# Patient Record
Sex: Female | Born: 1970 | Race: Black or African American | Hispanic: No | Marital: Single | Smoking: Never smoker
Health system: Southern US, Community
[De-identification: ages and names within clinical notes are randomized; demographics above are authoritative.]

## PROBLEM LIST (undated history)

## (undated) DIAGNOSIS — C801 Malignant (primary) neoplasm, unspecified: Secondary | ICD-10-CM

## (undated) DIAGNOSIS — G43909 Migraine, unspecified, not intractable, without status migrainosus: Secondary | ICD-10-CM

## (undated) DIAGNOSIS — I2699 Other pulmonary embolism without acute cor pulmonale: Secondary | ICD-10-CM

## (undated) DIAGNOSIS — J45909 Unspecified asthma, uncomplicated: Secondary | ICD-10-CM

## (undated) DIAGNOSIS — R112 Nausea with vomiting, unspecified: Secondary | ICD-10-CM

## (undated) DIAGNOSIS — R609 Edema, unspecified: Secondary | ICD-10-CM

## (undated) DIAGNOSIS — D649 Anemia, unspecified: Secondary | ICD-10-CM

## (undated) DIAGNOSIS — J189 Pneumonia, unspecified organism: Secondary | ICD-10-CM

## (undated) DIAGNOSIS — R32 Unspecified urinary incontinence: Secondary | ICD-10-CM

## (undated) DIAGNOSIS — N879 Dysplasia of cervix uteri, unspecified: Secondary | ICD-10-CM

## (undated) DIAGNOSIS — N390 Urinary tract infection, site not specified: Secondary | ICD-10-CM

## (undated) DIAGNOSIS — J40 Bronchitis, not specified as acute or chronic: Secondary | ICD-10-CM

## (undated) DIAGNOSIS — K219 Gastro-esophageal reflux disease without esophagitis: Secondary | ICD-10-CM

## (undated) DIAGNOSIS — Z9889 Other specified postprocedural states: Secondary | ICD-10-CM

## (undated) DIAGNOSIS — T148XXA Other injury of unspecified body region, initial encounter: Secondary | ICD-10-CM

## (undated) HISTORY — DX: Anemia, unspecified: D64.9

## (undated) HISTORY — PX: COLONOSCOPY: SHX174

## (undated) HISTORY — DX: Edema, unspecified: R60.9

## (undated) HISTORY — PX: FIBULA FRACTURE SURGERY: SHX947

## (undated) HISTORY — DX: Nausea with vomiting, unspecified: R11.2

## (undated) HISTORY — DX: Urinary tract infection, site not specified: N39.0

## (undated) HISTORY — DX: Bronchitis, not specified as acute or chronic: J40

## (undated) HISTORY — DX: Other specified postprocedural states: Z98.890

## (undated) HISTORY — PX: TOTAL ANKLE REPLACEMENT: SUR1218

## (undated) HISTORY — DX: Unspecified urinary incontinence: R32

## (undated) HISTORY — DX: Unspecified asthma, uncomplicated: J45.909

## (undated) HISTORY — DX: Pneumonia, unspecified organism: J18.9

## (undated) HISTORY — DX: Gastro-esophageal reflux disease without esophagitis: K21.9

## (undated) HISTORY — DX: Other injury of unspecified body region, initial encounter: T14.8XXA

## (undated) HISTORY — DX: Dysplasia of cervix uteri, unspecified: N87.9

---

## 2007-11-25 DIAGNOSIS — I2699 Other pulmonary embolism without acute cor pulmonale: Secondary | ICD-10-CM

## 2007-11-25 HISTORY — DX: Other pulmonary embolism without acute cor pulmonale: I26.99

## 2012-11-24 DIAGNOSIS — Z5189 Encounter for other specified aftercare: Secondary | ICD-10-CM

## 2012-11-24 HISTORY — DX: Encounter for other specified aftercare: Z51.89

## 2013-10-18 ENCOUNTER — Encounter: Payer: Self-pay | Admitting: Pain Medicine

## 2013-10-18 ENCOUNTER — Ambulatory Visit: Payer: Self-pay

## 2013-10-18 ENCOUNTER — Ambulatory Visit: Payer: BLUE CROSS/BLUE SHIELD | Admitting: Pain Medicine

## 2013-10-18 ENCOUNTER — Ambulatory Visit
Admission: RE | Admit: 2013-10-18 | Discharge: 2013-10-18 | Disposition: A | Discharge status: Home / Self Care | Payer: BLUE CROSS/BLUE SHIELD | Source: Ambulatory Visit | Attending: Obstetrics & Gynecology | Admitting: Obstetrics & Gynecology

## 2013-10-18 ENCOUNTER — Encounter
Admission: RE | Disposition: A | Discharge status: Home / Self Care | Payer: Self-pay | Source: Ambulatory Visit | Attending: Obstetrics & Gynecology

## 2013-10-18 ENCOUNTER — Ambulatory Visit: Payer: BLUE CROSS/BLUE SHIELD | Admitting: Obstetrics & Gynecology

## 2013-10-18 DIAGNOSIS — Z91040 Latex allergy status: Secondary | ICD-10-CM | POA: Insufficient documentation

## 2013-10-18 DIAGNOSIS — Z86711 Personal history of pulmonary embolism: Secondary | ICD-10-CM | POA: Insufficient documentation

## 2013-10-18 DIAGNOSIS — N925 Other specified irregular menstruation: Secondary | ICD-10-CM | POA: Insufficient documentation

## 2013-10-18 DIAGNOSIS — J45909 Unspecified asthma, uncomplicated: Secondary | ICD-10-CM | POA: Insufficient documentation

## 2013-10-18 DIAGNOSIS — D250 Submucous leiomyoma of uterus: Secondary | ICD-10-CM | POA: Insufficient documentation

## 2013-10-18 DIAGNOSIS — Z880 Allergy status to penicillin: Secondary | ICD-10-CM | POA: Insufficient documentation

## 2013-10-18 DIAGNOSIS — N938 Other specified abnormal uterine and vaginal bleeding: Secondary | ICD-10-CM | POA: Diagnosis present

## 2013-10-18 HISTORY — PX: D & C, HYSTEROSCOPY: SHX3660

## 2013-10-18 LAB — POCT PREGNANCY TEST, URINE HCG
POCT Pregnancy HCG Test, UR: NEGATIVE
POCT Pregnancy HCG Test, UR: NEGATIVE

## 2013-10-18 SURGERY — HYSTEROSCOPY, ENDOMETRIAL BIOPSY/POLYPECTOMY, WITH DILATION AND CURETTAGE (D&C)
Anesthesia: Anesthesia General | Site: Pelvis | Wound class: Clean Contaminated

## 2013-10-18 MED ORDER — ONDANSETRON HCL 4 MG/2ML IJ SOLN
INTRAMUSCULAR | Status: AC
Start: 2013-10-18 — End: ?
  Filled 2013-10-18: qty 2

## 2013-10-18 MED ORDER — PROPOFOL 10 MG/ML IV EMUL
INTRAVENOUS | Status: AC
Start: 2013-10-18 — End: ?
  Filled 2013-10-18: qty 20

## 2013-10-18 MED ORDER — MORPHINE SULFATE 2 MG/ML IJ/IV SOLN (WRAP)
Status: AC
Start: 2013-10-18 — End: 2013-10-18
  Administered 2013-10-18: 2 mg via INTRAVENOUS
  Filled 2013-10-18: qty 1

## 2013-10-18 MED ORDER — DEXAMETHASONE SOD PHOSPHATE PF 10 MG/ML IJ SOLN
INTRAMUSCULAR | Status: AC
Start: 2013-10-18 — End: ?
  Filled 2013-10-18: qty 1

## 2013-10-18 MED ORDER — PROPOFOL INFUSION 10 MG/ML
INTRAVENOUS | Status: DC | PRN
Start: 2013-10-18 — End: 2013-10-18
  Administered 2013-10-18: 200 mg via INTRAVENOUS

## 2013-10-18 MED ORDER — LACTATED RINGERS IV SOLN
INTRAVENOUS | Status: DC | PRN
Start: 2013-10-18 — End: 2013-10-18

## 2013-10-18 MED ORDER — LEVALBUTEROL HCL 0.63 MG/3ML IN NEBU
0.6300 mg | INHALATION_SOLUTION | Freq: Once | RESPIRATORY_TRACT | Status: AC
Start: 2013-10-18 — End: 2013-10-18
  Administered 2013-10-18: 0.63 mg via RESPIRATORY_TRACT

## 2013-10-18 MED ORDER — ACETAMINOPHEN-CODEINE #3 300-30 MG PO TABS
1.0000 | ORAL_TABLET | Freq: Once | ORAL | Status: AC
Start: 2013-10-18 — End: 2013-10-18
  Administered 2013-10-18: 1 via ORAL

## 2013-10-18 MED ORDER — MORPHINE SULFATE 2 MG/ML IJ/IV SOLN (WRAP)
2.0000 mg | Status: DC | PRN
Start: 2013-10-18 — End: 2013-10-18

## 2013-10-18 MED ORDER — FENTANYL CITRATE 0.05 MG/ML IJ SOLN
25.0000 ug | INTRAMUSCULAR | Status: DC | PRN
Start: 2013-10-18 — End: 2013-10-18

## 2013-10-18 MED ORDER — LIDOCAINE HCL (PF) 2 % IJ SOLN
INTRAMUSCULAR | Status: AC
Start: 2013-10-18 — End: ?
  Filled 2013-10-18: qty 5

## 2013-10-18 MED ORDER — FENTANYL CITRATE 0.05 MG/ML IJ SOLN
INTRAMUSCULAR | Status: AC
Start: 2013-10-18 — End: ?
  Filled 2013-10-18: qty 2

## 2013-10-18 MED ORDER — PROMETHAZINE HCL 25 MG/ML IJ SOLN
6.2500 mg | Freq: Once | INTRAMUSCULAR | Status: DC | PRN
Start: 2013-10-18 — End: 2013-10-18

## 2013-10-18 MED ORDER — OXYCODONE-ACETAMINOPHEN 5-325 MG PO TABS
1.0000 | ORAL_TABLET | Freq: Once | ORAL | Status: DC | PRN
Start: 2013-10-18 — End: 2013-10-18

## 2013-10-18 MED ORDER — ONDANSETRON HCL 4 MG/2ML IJ SOLN
4.0000 mg | Freq: Once | INTRAMUSCULAR | Status: DC | PRN
Start: 2013-10-18 — End: 2013-10-18

## 2013-10-18 MED ORDER — LEVALBUTEROL HCL 0.63 MG/3ML IN NEBU
0.6300 mg | INHALATION_SOLUTION | Freq: Once | RESPIRATORY_TRACT | Status: DC
Start: 2013-10-18 — End: 2013-10-18
  Filled 2013-10-18 (×2): qty 1

## 2013-10-18 MED ORDER — LACTATED RINGERS IV SOLN
INTRAVENOUS | Status: DC
Start: 2013-10-18 — End: 2013-10-18

## 2013-10-18 MED ORDER — SODIUM CHLORIDE 0.9 % IR SOLN
Status: DC | PRN
Start: 2013-10-18 — End: 2013-10-18
  Administered 2013-10-18: 200 mL

## 2013-10-18 MED ORDER — KETOROLAC TROMETHAMINE 60 MG/2ML IM SOLN
INTRAMUSCULAR | Status: AC
Start: 2013-10-18 — End: ?
  Filled 2013-10-18: qty 2

## 2013-10-18 MED ORDER — MIDAZOLAM HCL 5 MG/5ML IJ SOLN
INTRAMUSCULAR | Status: AC
Start: 2013-10-18 — End: ?
  Filled 2013-10-18: qty 5

## 2013-10-18 MED ORDER — HYDROMORPHONE HCL PF 1 MG/ML IJ SOLN
0.2000 mg | INTRAMUSCULAR | Status: DC | PRN
Start: 2013-10-18 — End: 2013-10-18

## 2013-10-18 MED ORDER — ACETAMINOPHEN-CODEINE #3 300-30 MG PO TABS
ORAL_TABLET | ORAL | Status: AC
Start: 2013-10-18 — End: ?
  Filled 2013-10-18: qty 1

## 2013-10-18 MED ORDER — FENTANYL CITRATE 0.05 MG/ML IJ SOLN
INTRAMUSCULAR | Status: DC | PRN
Start: 2013-10-18 — End: 2013-10-18
  Administered 2013-10-18 (×2): 50 ug via INTRAVENOUS

## 2013-10-18 MED ORDER — DEXAMETHASONE SODIUM PHOSPHATE 4 MG/ML IJ SOLN (WRAP)
INTRAMUSCULAR | Status: DC | PRN
Start: 2013-10-18 — End: 2013-10-18
  Administered 2013-10-18: 8 mg via INTRAVENOUS

## 2013-10-18 MED ORDER — LIDOCAINE HCL 2 % IJ SOLN
INTRAMUSCULAR | Status: DC | PRN
Start: 2013-10-18 — End: 2013-10-18
  Administered 2013-10-18: 25 mg

## 2013-10-18 MED ORDER — MIDAZOLAM HCL 2 MG/2ML IJ SOLN
INTRAMUSCULAR | Status: DC | PRN
Start: 2013-10-18 — End: 2013-10-18
  Administered 2013-10-18: 3 mg via INTRAVENOUS

## 2013-10-18 MED ORDER — ONDANSETRON HCL 4 MG/2ML IJ SOLN
INTRAMUSCULAR | Status: DC | PRN
Start: 2013-10-18 — End: 2013-10-18
  Administered 2013-10-18: 4 mg via INTRAVENOUS

## 2013-10-18 SURGICAL SUPPLY — 28 items
CATH URETHRAL RED RUBBER 16F (Catheter Urine) ×2 IMPLANT
DRAPE UNDER BUTTOCKS ADHESIVE AURORA 40X44IN DYNJP6004 (Drape) ×1 IMPLANT
DRAPE UNDERBUTTOCKS W/POUCH (Drape) ×2
DRESSING TELFA 3X8IN STERILE (Dressing) ×2 IMPLANT
GLOVE BIOGEL SURG PF SZ 8.5 (Glove) ×2 IMPLANT
GLOVE HANDLE LITE DISP (Procedure Accessories) ×2 IMPLANT
GOWN SRG PRFRM FBRC LG STDLN AERO BLU LF (Gown) ×1
GOWN SURGICAL LARGE STANDARD LENGTH (Gown) ×1
GOWN SURGICAL LARGE STANDARD LENGTH PERFORMANCE FABRIC AERO BLUE GREEN (Gown) ×1 IMPLANT
PACK LITHOTOMY (Pack) ×2 IMPLANT
PAD PREP CUFF 24X41IN W 9IN (Prep) ×2 IMPLANT
PAD SANITARY L12.25 IN X W4.25 IN HEAVY ABSORBENT MOISTURE BARRIER (Dressing) ×1 IMPLANT
PAD SNTR SLK FLF CRTY 12.25X4.25IN LF NS (Dressing) ×2
SET IRR DEHP 10 GTT/ML STRG 81IN LF STRL (Tubing) ×1
SET IRRIGATION L81 IN 10 GTT/ML STRAIGHT (Tubing) ×1
SET IRRIGATION L81 IN 10 GTT/ML STRAIGHT NA DEHP BLADDER REGULATE (Tubing) ×1 IMPLANT
SOL NACL 0.9% IRR 3000CC (Irrigation Solutions) ×1
SOLN LUBRICATING JELLY 4.25OZ (Irrigation Solutions) ×2 IMPLANT
SOLUTION IRR 0.9% NACL 3L URMTC LF PLS (Irrigation Solutions) ×1
SOLUTION IRRIGATION 0.9% SODIUM CHLORIDE 3000 ML PLASTIC CONTAINER (Irrigation Solutions) ×1 IMPLANT
SPONGE SRG VISTEC 8X4IN LF STRL 12 PLY (Sponge) ×1
SPONGE SURGICAL L8 IN X W4 IN 12 PLY (Sponge) ×1
SPONGE SURGICAL L8 IN X W4 IN 12 PLY RADIOPAQUE BAND VISTEC BLUE WHITE (Sponge) ×1 IMPLANT
TOWEL L26 IN X W17 IN COTTON PREWASH (Procedure Accessories) ×1
TOWEL L26 IN X W17 IN COTTON PREWASH DELINT BLUE ACTISORB SURGICAL (Procedure Accessories) ×1 IMPLANT
TOWEL SRG CTTN 26X17IN LF STRL PREWASH (Procedure Accessories) ×1
TRAY SKIN BETANDINE PREP (Tray) ×2 IMPLANT
UNDRGLOV SZ8.5 PI INDICATR BLU (Glove) ×2 IMPLANT

## 2013-10-18 NOTE — Anesthesia Preprocedure Evaluation (Addendum)
Anesthesia Evaluation    AIRWAY    Mallampati: III    TM distance: >3 FB  Neck ROM: full  Mouth Opening:full   CARDIOVASCULAR    regular and normal       DENTAL    No notable dental hx     PULMONARY    pulmonary exam normal     OTHER FINDINGS    Obese BMI 37.9, hx of asthma, currently no wheezing noted                  Anesthesia Plan    ASA 2     general                     intravenous induction   Detailed anesthesia plan: general endotracheal and general LMA  Monitors/Adjuncts: other    Post Op: other  Post op pain management: per surgeon    informed consent obtained    Plan discussed with CRNA.      pertinent labs reviewed

## 2013-10-18 NOTE — H&P (Signed)
ADMISSION HISTORY AND PHYSICAL EXAM    Date Time: 10/18/2013 7:40 AM  Patient Name: Kaitlyn Wilcox  Attending Physician: Denny Levy, MD    History of Present Illness:   Kaitlyn Wilcox is a 42 y.o. female who presents to the hospital for [facility] for heavy menses.    Past Medical History:     Past Medical History   Diagnosis Date   . Asthma without status asthmaticus    . Pulmonary embolism 2009   . Cervical dysplasia    . Post-operative nausea and vomiting        Past Surgical History:   No past surgical history on file.    Family History:     Family History   Problem Relation Age of Onset   . Malignant hyperthermia Neg Hx    . Pseudochol deficiency Neg Hx    . Anesthesia problems Neg Hx        Social History:     History     Social History   . Marital Status: Divorced     Spouse Name: N/A     Number of Children: N/A   . Years of Education: N/A     Social History Main Topics   . Smoking status: Never Smoker    . Smokeless tobacco: Not on file   . Alcohol Use: 1.8 oz/week     3 Glasses of wine per week   . Drug Use:    . Sexually Active:      Other Topics Concern   . Not on file     Social History Narrative   . No narrative on file       Allergies:     Allergies   Allergen Reactions   . Dilaudid (Hydromorphone Hcl) Other (See Comments)     "stop breathing"   . Penicillins Anaphylaxis   . Latex Swelling     Swelling-contact   . Nuts (Peanut Oil) Rash   . Percocet (Oxycodone-Acetaminophen) Hives       Medications:   See Medication List    Review of Systems:   General ROS: negative  Psychological ROS: negative  HEENT ROS: negative  Hematological and Lymphatic ROS: negative  Endocrine ROS: negative  Breast ROS: negative for breast lumps  Respiratory ROS: no cough, shortness of breath, or wheezing  Cardiovascular ROS: no chest pain or dyspnea on exertion  Gastrointestinal ROS: no abdominal pain, change in bowel habits, or black or bloody stools  Genito-Urinary ROS: no dysuria, trouble voiding, or  hematuria  Musculoskeletal ROS: negative  Neurological ROS: no TIA or stroke symptoms  Dermatological ROS: negative    Physical Exam:   There were no vitals filed for this visit.      No intake or output data in the 24 hours ending 10/18/13 0740    General appearance - alert, well appearing, and in no distress  Mental status - alert, oriented to person, place, and time  Neck - supple, no significant adenopathy  Lymphatics - no palpable lymphadenopathy, no hepatosplenomegaly  Chest - clear to auscultation, no wheezes, rales or rhonchi, symmetric air entry  Heart - normal rate, regular rhythm, normal S1, S2, no murmurs, rubs, clicks or gallops  Abdomen - soft, nontender, nondistended, no masses or organomegaly  Breasts - breasts appear normal, no suspicious masses, no skin or nipple changes or axillary nodes  Pelvic - normal external genitalia, vulva, vagina, cervix, enlarged uterus and adnexa  Rectal - normal rectal, no masses  Back  exam - full range of motion, no tenderness, palpable spasm or pain on motion  Neurological - alert, oriented, normal speech, no focal findings or movement disorder noted  Musculoskeletal - no joint tenderness, deformity or swelling  Extremities - peripheral pulses normal, no pedal edema, no clubbing or cyanosis  Skin - normal coloration and turgor, no rashes, no suspicious skin lesions noted    Labs:     Results     ** No Results found for the last 24 hours. **              Rads:   Radiological Procedure reviewed.      Assessment:   DUB    Plan:   D&C        Denny Levy, MD  10/18/2013  7:40 AM

## 2013-10-18 NOTE — Anesthesia Postprocedure Evaluation (Signed)
Anesthesia Post Evaluation    Patient: Kaitlyn Wilcox    Procedures performed: Procedure(s) with comments:  HYSTEROSCOPY, DIAGNOSTIC    Anesthesia type: General LMA    Patient location:Phase II PACU    Last vitals:   Filed Vitals:    10/18/13 1414   BP: 137/68   Pulse:    Temp:    Resp: 16   SpO2: 98%       Post pain: Patient not complaining of pain, continue current therapy      Mental Status:awake and alert     Respiratory Function: tolerating room air    Cardiovascular: stable    Nausea/Vomiting: patient not complaining of nausea or vomiting    Hydration Status: adequate    Post assessment: no apparent anesthetic complications

## 2013-10-18 NOTE — Op Note (Signed)
D&C OP NOTE    Date Time: 10/18/2013 12:36 PM  Patient Name:   Kaitlyn Wilcox    Date of Operation:   10/18/2013    Preoperative Diagnosis:   Pre-Op Diagnosis Codes:     * Other disorder of menstruation and other abnormal bleeding from female genital tract [626.8]    Postoperative Diagnosis:   Fibroid uterus, DUB    Providers Performing:   Denny Levy    Assistant (s): None    Operative Procedure:   Diagnostic Hysteroscopy, Dilatation and Curettage.       Findings:   16 plus week size fibriod uterus    Anesthesia:   General       Estimated Blood Loss:   Minimal      Specimens:   Endometrial Currettings      Indication for Procedure:   Fibroids, DUB      Description of procedure:    The patient was given general anesthesia by the anesthesia department,   prepped and draped in a sterile fashion. When examined in the dorsal   lithotomy position, she was found a 16 week size  uterus. No adnexal masses   could be felt. The cervical os was opened with graduated Shawnie Pons dilators   and a 30-degree hysteroscope used to look into the uterine cavity. The   Cavity ws enlarged and multiple small submucous myomas were seen.  The tissue seen was removed with a medium-size   curette and sent for histologic studies. The patient tolerated the   procedure well and was taken to the recovery room in satisfactory   condition.      Complications:   none    Condition:   stable        Denny Levy, MD  10/18/2013  12:36 PM

## 2013-10-18 NOTE — Transfer of Care (Signed)
Anesthesia Transfer of Care Note    Patient: Kaitlyn Wilcox    Procedures performed: Procedure(s) with comments:  HYSTEROSCOPY, DIAGNOSTIC    Anesthesia type: General LMA    Patient location:Phase I PACU    Last vitals:   Filed Vitals:    10/18/13 1245   BP: 139/69   Pulse: 58   Temp: 97 F (36.1 C)   Resp: 22   SpO2: 100%       Post pain: Patient not complaining of pain, continue current therapy      Mental Status:awake and alert     Respiratory Function: tolerating nasal cannula    Cardiovascular: stable    Nausea/Vomiting: patient not complaining of nausea or vomiting    Hydration Status: adequate    Post assessment: no apparent anesthetic complications, no reportable events and no evidence of recall

## 2013-10-18 NOTE — Discharge Instructions (Signed)
Kenneth A. Desandies, MD    [address] [place] [address], [address]  [address], [address]  [place] [address]    [place], [place] [address]  [phone]              [phone]    Post D&C Instructions    After this procedure, you may feel sluggish for the next 24-48 hours.  However, usually after the first 24 hours, a patient may resume normal activities.    Diet: Resume a normal diet as soon as you can keep fluids down comfortably, however heavy foods, i.e. fried foods, should not be consumed immediately.    Pain:      Advil or Motrin and a heating pad usually relieves most discomfort, however a   prescription for ___________________will be given to you for severe pain.     Bathing:  You may shower at any time. Tub bath should be delayed until the bleeding has      stopped completely.    Bleeding: The amount of bleeding varies greatly.  There may be slight spotting for         several weeks.  Menstrual periods may or may not start at the expected time,         due to the procedure itself or the reason for the procedure, i.e. a miscarriage.           If you have been given a prescription for the birth control pill, start them on          the fifth day after surgery.    Douching:  Do not douche unless instructed to do so.    Intercourse:  If not bleeding, may resume in 2 weeks.    Call immediately for:    Fever greater than 100.4  Frequency or burning with urination  Severe vaginal bleeding, i.e. soaking a pad every 15-30 minutes  Severe lower abdominal pain    Post Anesthesia Discharge Instructions    Although you may be awake and alert in the recovery room, small amounts of anesthetic remain in your system for about 24 hours.  You may feel tired and sleepy during this time.      You are advised to go directly home from the hospital.    Plan to stay at home and rest for the remainder of the day.    It is advisable to have someone with you at home for 24 hours after surgery.    Do not operate a motor vehicle, or any  mechanical or electrical equipment for the next 24 hours.      Be careful when you are walking around, you may become dizzy.  The effects of anesthesia and/or medications are still present and drowsiness may occur    Do not consume alcohol, tranquilizers, sleeping medications, or any other non prescribed medication for the remainder of the day.    Diet:  begin with liquids, progress your diet as tolerated or as directed by your surgeon.  Nausea and vomiting may occur in the next 24 hours.

## 2013-10-19 ENCOUNTER — Encounter: Payer: Self-pay | Admitting: Obstetrics & Gynecology

## 2013-11-01 ENCOUNTER — Encounter
Admission: RE | Disposition: A | Discharge status: Home / Self Care | Payer: Self-pay | Source: Ambulatory Visit | Attending: Obstetrics & Gynecology

## 2013-11-01 ENCOUNTER — Encounter: Payer: Self-pay | Admitting: Anesthesiology

## 2013-11-01 ENCOUNTER — Inpatient Hospital Stay: Payer: BLUE CROSS/BLUE SHIELD | Admitting: Obstetrics & Gynecology

## 2013-11-01 ENCOUNTER — Inpatient Hospital Stay
Admission: RE | Admit: 2013-11-01 | Discharge: 2013-11-04 | DRG: 742 | Disposition: A | Discharge status: Home / Self Care | Payer: BLUE CROSS/BLUE SHIELD | Source: Ambulatory Visit | Attending: Obstetrics & Gynecology | Admitting: Obstetrics & Gynecology

## 2013-11-01 ENCOUNTER — Ambulatory Visit: Payer: BLUE CROSS/BLUE SHIELD | Admitting: Anesthesiology

## 2013-11-01 DIAGNOSIS — K560 Paralytic ileus: Secondary | ICD-10-CM | POA: Diagnosis not present

## 2013-11-01 DIAGNOSIS — Z91040 Latex allergy status: Secondary | ICD-10-CM

## 2013-11-01 DIAGNOSIS — Z885 Allergy status to narcotic agent status: Secondary | ICD-10-CM

## 2013-11-01 DIAGNOSIS — N879 Dysplasia of cervix uteri, unspecified: Secondary | ICD-10-CM | POA: Diagnosis present

## 2013-11-01 DIAGNOSIS — Z23 Encounter for immunization: Secondary | ICD-10-CM

## 2013-11-01 DIAGNOSIS — Z96669 Presence of unspecified artificial ankle joint: Secondary | ICD-10-CM

## 2013-11-01 DIAGNOSIS — N925 Other specified irregular menstruation: Secondary | ICD-10-CM | POA: Diagnosis present

## 2013-11-01 DIAGNOSIS — Z91010 Allergy to peanuts: Secondary | ICD-10-CM

## 2013-11-01 DIAGNOSIS — J45909 Unspecified asthma, uncomplicated: Secondary | ICD-10-CM | POA: Diagnosis present

## 2013-11-01 DIAGNOSIS — Z880 Allergy status to penicillin: Secondary | ICD-10-CM

## 2013-11-01 DIAGNOSIS — N8000 Endometriosis of the uterus, unspecified: Principal | ICD-10-CM | POA: Diagnosis present

## 2013-11-01 DIAGNOSIS — Z86711 Personal history of pulmonary embolism: Secondary | ICD-10-CM

## 2013-11-01 DIAGNOSIS — Y9210 Unspecified residential institution as the place of occurrence of the external cause: Secondary | ICD-10-CM | POA: Diagnosis not present

## 2013-11-01 DIAGNOSIS — D259 Leiomyoma of uterus, unspecified: Secondary | ICD-10-CM | POA: Diagnosis present

## 2013-11-01 DIAGNOSIS — K929 Disease of digestive system, unspecified: Secondary | ICD-10-CM | POA: Diagnosis not present

## 2013-11-01 DIAGNOSIS — Y836 Removal of other organ (partial) (total) as the cause of abnormal reaction of the patient, or of later complication, without mention of misadventure at the time of the procedure: Secondary | ICD-10-CM | POA: Diagnosis not present

## 2013-11-01 HISTORY — PX: HYSTERECTOMY, ABDOMINAL, TOTAL: SHX4214

## 2013-11-01 LAB — BLOOD GAS, VENOUS
Base Excess, Ven: 0 mEq/L
HCO3, Ven: 25.2 mEq/L
O2 Sat, Venous: 50.6 %
Temperature: 37
Venous Total CO2: 23.1 mEq/L
pCO2, Venous: 45.5
pH, Ven: 7.362
pO2, Venous: 30.6

## 2013-11-01 LAB — TYPE AND SCREEN
AB Screen Gel: NEGATIVE
ABO Rh: O POS

## 2013-11-01 LAB — POCT PREGNANCY TEST, URINE HCG: POCT Pregnancy HCG Test, UR: NEGATIVE

## 2013-11-01 SURGERY — HYSTERECTOMY, ABDOMINAL, TOTAL (OPEN)
Anesthesia: Anesthesia General | Site: Pelvis | Wound class: Clean Contaminated

## 2013-11-01 MED ORDER — NALOXONE HCL 0.4 MG/ML IJ SOLN
0.1000 mg | INTRAMUSCULAR | Status: DC | PRN
Start: 2013-11-01 — End: 2013-11-01

## 2013-11-01 MED ORDER — KETOROLAC TROMETHAMINE 30 MG/ML IJ SOLN
30.0000 mg | Freq: Four times a day (QID) | INTRAMUSCULAR | Status: DC
Start: 2013-11-01 — End: 2013-11-04
  Administered 2013-11-01 – 2013-11-04 (×12): 30 mg via INTRAVENOUS
  Filled 2013-11-01 (×12): qty 1

## 2013-11-01 MED ORDER — DEXAMETHASONE SODIUM PHOSPHATE 4 MG/ML IJ SOLN (WRAP)
INTRAMUSCULAR | Status: DC | PRN
Start: 2013-11-01 — End: 2013-11-01
  Administered 2013-11-01: 10 mg via INTRAVENOUS

## 2013-11-01 MED ORDER — FENTANYL CITRATE 0.05 MG/ML IJ SOLN
INTRAMUSCULAR | Status: DC | PRN
Start: 2013-11-01 — End: 2013-11-01
  Administered 2013-11-01: 50 ug via INTRAVENOUS
  Administered 2013-11-01: 150 ug via INTRAVENOUS
  Administered 2013-11-01 (×2): 100 ug via INTRAVENOUS
  Administered 2013-11-01 (×2): 50 ug via INTRAVENOUS

## 2013-11-01 MED ORDER — SODIUM CHLORIDE 0.9 % IV SOLN
INTRAVENOUS | Status: DC | PRN
Start: 2013-11-01 — End: 2013-11-01

## 2013-11-01 MED ORDER — PNEUMOCOCCAL VAC POLYVALENT 25 MCG/0.5ML IJ INJ
0.5000 mL | INJECTION | INTRAMUSCULAR | Status: AC | PRN
Start: 2013-11-01 — End: 2013-11-04
  Administered 2013-11-04: 0.5 mL via INTRAMUSCULAR
  Filled 2013-11-01: qty 0.5

## 2013-11-01 MED ORDER — ENOXAPARIN SODIUM 40 MG/0.4ML SC SOLN
40.0000 mg | Freq: Every morning | SUBCUTANEOUS | Status: DC
Start: 2013-11-02 — End: 2013-11-04
  Administered 2013-11-02 – 2013-11-04 (×3): 40 mg via SUBCUTANEOUS
  Filled 2013-11-01 (×3): qty 0.4

## 2013-11-01 MED ORDER — LEVOFLOXACIN IN D5W 500 MG/100ML IV SOLN
500.0000 mg | Freq: Once | INTRAVENOUS | Status: AC
Start: 2013-11-01 — End: 2013-11-01
  Administered 2013-11-01: 500 mg via INTRAVENOUS

## 2013-11-01 MED ORDER — MORPHINE SULFATE 2 MG/ML IJ/IV SOLN (WRAP)
4.0000 mg | Freq: Once | Status: AC
Start: 2013-11-01 — End: 2013-11-01
  Administered 2013-11-01: 4 mg via INTRAVENOUS
  Filled 2013-11-01: qty 2

## 2013-11-01 MED ORDER — ALBUTEROL SULFATE (2.5 MG/3ML) 0.083% IN NEBU
2.5000 mg | INHALATION_SOLUTION | RESPIRATORY_TRACT | Status: DC
Start: 2013-11-01 — End: 2013-11-02

## 2013-11-01 MED ORDER — FENTANYL CITRATE 0.05 MG/ML IJ SOLN
INTRAMUSCULAR | Status: AC
Start: 2013-11-01 — End: 2013-11-01
  Administered 2013-11-01: 25 ug via INTRAVENOUS
  Filled 2013-11-01: qty 2

## 2013-11-01 MED ORDER — CLINDAMYCIN PHOSPHATE IN D5W 900 MG/50ML IV SOLN
900.0000 mg | Freq: Once | INTRAVENOUS | Status: DC
Start: 2013-11-01 — End: 2013-11-01

## 2013-11-01 MED ORDER — LACTATED RINGERS IV SOLN
INTRAVENOUS | Status: DC | PRN
Start: 2013-11-01 — End: 2013-11-01

## 2013-11-01 MED ORDER — FENTANYL CITRATE 0.05 MG/ML IJ SOLN
INTRAMUSCULAR | Status: AC
Start: 2013-11-01 — End: ?
  Filled 2013-11-01: qty 5

## 2013-11-01 MED ORDER — MONTELUKAST SODIUM 10 MG PO TABS
10.0000 mg | ORAL_TABLET | Freq: Every evening | ORAL | Status: DC
Start: 2013-11-01 — End: 2013-11-04
  Administered 2013-11-01 – 2013-11-03 (×3): 10 mg via ORAL
  Filled 2013-11-01 (×3): qty 1

## 2013-11-01 MED ORDER — SENNOSIDES-DOCUSATE SODIUM 8.6-50 MG PO TABS
1.0000 | ORAL_TABLET | Freq: Two times a day (BID) | ORAL | Status: DC | PRN
Start: 2013-11-01 — End: 2013-11-01

## 2013-11-01 MED ORDER — NALOXONE HCL 0.4 MG/ML IJ SOLN
0.1000 mg | INTRAMUSCULAR | Status: AC | PRN
Start: 2013-11-01 — End: 2013-11-02

## 2013-11-01 MED ORDER — LEVOFLOXACIN IN D5W 500 MG/100ML IV SOLN
INTRAVENOUS | Status: AC
Start: 2013-11-01 — End: ?
  Filled 2013-11-01: qty 100

## 2013-11-01 MED ORDER — SENNOSIDES-DOCUSATE SODIUM 8.6-50 MG PO TABS
1.0000 | ORAL_TABLET | Freq: Two times a day (BID) | ORAL | Status: DC | PRN
Start: 2013-11-01 — End: 2013-11-04

## 2013-11-01 MED ORDER — HYDROMORPHONE HCL 2 MG/ML IJ SOLN
INTRAMUSCULAR | Status: AC
Start: 2013-11-01 — End: ?
  Filled 2013-11-01: qty 1

## 2013-11-01 MED ORDER — HEPARIN SODIUM (PORCINE) 5000 UNIT/ML IJ SOLN
5000.0000 [IU] | Freq: Two times a day (BID) | INTRAMUSCULAR | Status: DC
Start: 2013-11-01 — End: 2013-11-01

## 2013-11-01 MED ORDER — FENTANYL PCA INFUSION 10 MCG/ML 100 ML (OUTSOURCED)
INTRAVENOUS | Status: DC
Start: 2013-11-01 — End: 2013-11-01
  Administered 2013-11-01: 1000 ug via INTRAVENOUS
  Filled 2013-11-01: qty 100

## 2013-11-01 MED ORDER — METHYLENE BLUE 1 % IJ SOLN
INTRAMUSCULAR | Status: DC | PRN
Start: 2013-11-01 — End: 2013-11-01
  Administered 2013-11-01: 100 mg via INTRAVENOUS

## 2013-11-01 MED ORDER — CEPASTAT 14.5 MG MT LOZG
1.0000 | LOZENGE | OROMUCOSAL | Status: DC | PRN
Start: 2013-11-01 — End: 2013-11-04
  Administered 2013-11-01: 1 via BUCCAL
  Filled 2013-11-01: qty 1

## 2013-11-01 MED ORDER — OXYCODONE-ACETAMINOPHEN 5-325 MG PO TABS
2.0000 | ORAL_TABLET | ORAL | Status: DC | PRN
Start: 2013-11-01 — End: 2013-11-01

## 2013-11-01 MED ORDER — PROMETHAZINE HCL 25 MG/ML IJ SOLN
6.2500 mg | Freq: Once | INTRAMUSCULAR | Status: AC | PRN
Start: 2013-11-01 — End: 2013-11-01

## 2013-11-01 MED ORDER — CEFAZOLIN SODIUM 1 G IJ SOLR
1.0000 g | Freq: Three times a day (TID) | INTRAMUSCULAR | Status: DC
Start: 2013-11-01 — End: 2013-11-01

## 2013-11-01 MED ORDER — MIDAZOLAM HCL 2 MG/2ML IJ SOLN
INTRAMUSCULAR | Status: AC
Start: 2013-11-01 — End: ?
  Filled 2013-11-01: qty 2

## 2013-11-01 MED ORDER — MORPHINE PCA 1MG/ML IN 100 ML NS-OUTSOURCED
INTRAVENOUS | Status: DC
Start: 2013-11-01 — End: 2013-11-02
  Administered 2013-11-01: 100 mg via INTRAVENOUS
  Filled 2013-11-01: qty 100

## 2013-11-01 MED ORDER — GLYCOPYRROLATE 0.2 MG/ML IJ SOLN
INTRAMUSCULAR | Status: AC
Start: 2013-11-01 — End: ?
  Filled 2013-11-01: qty 3

## 2013-11-01 MED ORDER — METHYLENE BLUE 1 % IJ SOLN
INTRAMUSCULAR | Status: AC
Start: 2013-11-01 — End: ?
  Filled 2013-11-01: qty 1

## 2013-11-01 MED ORDER — PROMETHAZINE HCL 25 MG/ML IJ SOLN
12.5000 mg | INTRAMUSCULAR | Status: DC | PRN
Start: 2013-11-01 — End: 2013-11-04
  Administered 2013-11-02: 12.5 mg via INTRAVENOUS
  Filled 2013-11-01: qty 1

## 2013-11-01 MED ORDER — LACTATED RINGERS IV SOLN
INTRAVENOUS | Status: DC
Start: 2013-11-01 — End: 2013-11-01

## 2013-11-01 MED ORDER — LIDOCAINE HCL (PF) 2 % IJ SOLN
INTRAMUSCULAR | Status: AC
Start: 2013-11-01 — End: ?
  Filled 2013-11-01: qty 5

## 2013-11-01 MED ORDER — FENTANYL PCA INFUSION 10 MCG/ML 100 ML (OUTSOURCED)
INTRAVENOUS | Status: AC
Start: 2013-11-01 — End: ?
  Filled 2013-11-01: qty 100

## 2013-11-01 MED ORDER — LABETALOL HCL 5 MG/ML IV SOLN
INTRAVENOUS | Status: AC
Start: 2013-11-01 — End: ?
  Filled 2013-11-01: qty 20

## 2013-11-01 MED ORDER — PROPOFOL INFUSION 10 MG/ML
INTRAVENOUS | Status: DC | PRN
Start: 2013-11-01 — End: 2013-11-01
  Administered 2013-11-01: 100 mg via INTRAVENOUS
  Administered 2013-11-01: 200 mg via INTRAVENOUS

## 2013-11-01 MED ORDER — NALBUPHINE HCL 10 MG/ML IJ SOLN
2.5000 mg | INTRAMUSCULAR | Status: AC | PRN
Start: 2013-11-01 — End: 2013-11-02
  Filled 2013-11-01: qty 1

## 2013-11-01 MED ORDER — PROPOFOL 10 MG/ML IV EMUL
INTRAVENOUS | Status: AC
Start: 2013-11-01 — End: ?
  Filled 2013-11-01: qty 20

## 2013-11-01 MED ORDER — PROMETHAZINE HCL 25 MG/ML IJ SOLN
12.5000 mg | INTRAMUSCULAR | Status: DC | PRN
Start: 2013-11-01 — End: 2013-11-01

## 2013-11-01 MED ORDER — DIPHENHYDRAMINE HCL 50 MG/ML IJ SOLN
12.5000 mg | Freq: Four times a day (QID) | INTRAMUSCULAR | Status: AC | PRN
Start: 2013-11-01 — End: 2013-11-02

## 2013-11-01 MED ORDER — HYDROCODONE-ACETAMINOPHEN 5-325 MG PO TABS
1.0000 | ORAL_TABLET | ORAL | Status: DC | PRN
Start: 2013-11-01 — End: 2013-11-01

## 2013-11-01 MED ORDER — LABETALOL HCL 5 MG/ML IV SOLN
INTRAVENOUS | Status: DC | PRN
Start: 2013-11-01 — End: 2013-11-01
  Administered 2013-11-01 (×2): 5 mg via INTRAVENOUS

## 2013-11-01 MED ORDER — LIDOCAINE HCL 2 % IJ SOLN
INTRAMUSCULAR | Status: DC | PRN
Start: 2013-11-01 — End: 2013-11-01
  Administered 2013-11-01: 100 mg

## 2013-11-01 MED ORDER — ONDANSETRON HCL 4 MG/2ML IJ SOLN
INTRAMUSCULAR | Status: AC
Start: 2013-11-01 — End: ?
  Filled 2013-11-01: qty 2

## 2013-11-01 MED ORDER — METHYLENE BLUE 1 % IJ SOLN
INTRAMUSCULAR | Status: AC
Start: 2013-11-01 — End: ?
  Filled 2013-11-01: qty 10

## 2013-11-01 MED ORDER — NEOSTIGMINE METHYLSULFATE 1 MG/ML IJ SOLN
INTRAMUSCULAR | Status: DC | PRN
Start: 2013-11-01 — End: 2013-11-01
  Administered 2013-11-01: 6 mL via INTRAVENOUS

## 2013-11-01 MED ORDER — CALCIUM CARBONATE ANTACID 500 MG PO CHEW
1000.0000 mg | CHEWABLE_TABLET | Freq: Four times a day (QID) | ORAL | Status: DC | PRN
Start: 2013-11-01 — End: 2013-11-04
  Administered 2013-11-01 – 2013-11-03 (×4): 1000 mg via ORAL
  Filled 2013-11-01 (×4): qty 2

## 2013-11-01 MED ORDER — DIPHENHYDRAMINE HCL 50 MG/ML IJ SOLN
12.5000 mg | Freq: Four times a day (QID) | INTRAMUSCULAR | Status: DC | PRN
Start: 2013-11-01 — End: 2013-11-01

## 2013-11-01 MED ORDER — ONDANSETRON HCL 4 MG/2ML IJ SOLN
4.0000 mg | Freq: Once | INTRAMUSCULAR | Status: AC | PRN
Start: 2013-11-01 — End: 2013-11-01
  Administered 2013-11-01: 4 mg via INTRAVENOUS
  Filled 2013-11-01: qty 2

## 2013-11-01 MED ORDER — ONDANSETRON HCL 4 MG/2ML IJ SOLN
INTRAMUSCULAR | Status: DC | PRN
Start: 2013-11-01 — End: 2013-11-01
  Administered 2013-11-01: 4 mg via INTRAVENOUS

## 2013-11-01 MED ORDER — DEXAMETHASONE SOD PHOSPHATE PF 10 MG/ML IJ SOLN
INTRAMUSCULAR | Status: AC
Start: 2013-11-01 — End: ?
  Filled 2013-11-01: qty 1

## 2013-11-01 MED ORDER — ROCURONIUM BROMIDE 50 MG/5ML IV SOLN
INTRAVENOUS | Status: DC | PRN
Start: 2013-11-01 — End: 2013-11-01
  Administered 2013-11-01: 50 mg via INTRAVENOUS

## 2013-11-01 MED ORDER — PROMETHAZINE HCL 25 MG/ML IJ SOLN
6.2500 mg | Freq: Once | INTRAMUSCULAR | Status: DC | PRN
Start: 2013-11-01 — End: 2013-11-01

## 2013-11-01 MED ORDER — MIDAZOLAM HCL 2 MG/2ML IJ SOLN
INTRAMUSCULAR | Status: DC | PRN
Start: 2013-11-01 — End: 2013-11-01
  Administered 2013-11-01: 2 mg via INTRAVENOUS

## 2013-11-01 MED ORDER — CLINDAMYCIN PHOSPHATE IN D5W 600 MG/50ML IV SOLN
INTRAVENOUS | Status: DC | PRN
Start: 2013-11-01 — End: 2013-11-01
  Administered 2013-11-01: 900 mg via INTRAVENOUS

## 2013-11-01 MED ORDER — ONDANSETRON HCL 4 MG/2ML IJ SOLN
4.0000 mg | Freq: Once | INTRAMUSCULAR | Status: DC | PRN
Start: 2013-11-01 — End: 2013-11-01

## 2013-11-01 MED ORDER — LACTATED RINGERS IV SOLN
INTRAVENOUS | Status: DC
Start: 2013-11-01 — End: 2013-11-04

## 2013-11-01 MED ORDER — FENTANYL CITRATE 0.05 MG/ML IJ SOLN
25.0000 ug | INTRAMUSCULAR | Status: AC | PRN
Start: 2013-11-01 — End: 2013-11-01
  Administered 2013-11-01 (×3): 25 ug via INTRAVENOUS

## 2013-11-01 MED ORDER — ROCURONIUM BROMIDE 50 MG/5ML IV SOLN
INTRAVENOUS | Status: AC
Start: 2013-11-01 — End: ?
  Filled 2013-11-01: qty 5

## 2013-11-01 MED ORDER — HEPARIN SODIUM (PORCINE) 5000 UNIT/ML IJ SOLN
5000.0000 [IU] | Freq: Two times a day (BID) | INTRAMUSCULAR | Status: DC
Start: 2013-11-01 — End: 2013-11-01
  Administered 2013-11-01: 5000 [IU] via SUBCUTANEOUS
  Filled 2013-11-01: qty 1

## 2013-11-01 MED ORDER — CLINDAMYCIN PHOSPHATE IN D5W 600 MG/50ML IV SOLN
600.0000 mg | Freq: Three times a day (TID) | INTRAVENOUS | Status: AC
Start: 2013-11-01 — End: 2013-11-02
  Administered 2013-11-01 – 2013-11-02 (×2): 600 mg via INTRAVENOUS
  Filled 2013-11-01 (×2): qty 50

## 2013-11-01 MED ORDER — PROMETHAZINE HCL 25 MG/ML IJ SOLN
INTRAMUSCULAR | Status: AC
Start: 2013-11-01 — End: 2013-11-01
  Administered 2013-11-01: 6.25 mg via INTRAVENOUS
  Filled 2013-11-01: qty 1

## 2013-11-01 MED ORDER — KETOROLAC TROMETHAMINE 60 MG/2ML IM SOLN
INTRAMUSCULAR | Status: AC
Start: 2013-11-01 — End: ?
  Filled 2013-11-01: qty 2

## 2013-11-01 MED ORDER — HYDROCODONE-ACETAMINOPHEN 5-325 MG PO TABS
1.0000 | ORAL_TABLET | ORAL | Status: DC | PRN
Start: 2013-11-01 — End: 2013-11-03
  Administered 2013-11-02: 1 via ORAL
  Filled 2013-11-01: qty 1

## 2013-11-01 MED ORDER — CETIRIZINE HCL 10 MG PO TABS
10.0000 mg | ORAL_TABLET | Freq: Every day | ORAL | Status: DC
Start: 2013-11-01 — End: 2013-11-04
  Administered 2013-11-01 – 2013-11-04 (×4): 10 mg via ORAL
  Filled 2013-11-01 (×4): qty 1

## 2013-11-01 MED ORDER — CLINDAMYCIN PHOSPHATE IN D5W 900 MG/50ML IV SOLN
INTRAVENOUS | Status: AC
Start: 2013-11-01 — End: ?
  Filled 2013-11-01: qty 50

## 2013-11-01 MED ORDER — NALBUPHINE HCL 10 MG/ML IJ SOLN
2.5000 mg | INTRAMUSCULAR | Status: DC | PRN
Start: 2013-11-01 — End: 2013-11-01

## 2013-11-01 SURGICAL SUPPLY — 42 items
APPLCATOR CHLORAPREP 26ML (Prep) ×2 IMPLANT
BANDAGE KERLIX MEDIUM GAUZE L3.6 YD X (Dressing) ×1 IMPLANT
BNDG KRLX GZE 3.6YDX6.4IN MED CTTN 6 PLY (Dressing) ×1
GLOVE BIOGEL SURG PF SZ 8.5 (Glove) ×2 IMPLANT
GOWN OPTIMA STRL BACK OR (Gown) ×2 IMPLANT
OPSITE POST-OP 15.5CM X 8.5CM (Dressing) ×4 IMPLANT
PAD ELECTROSRG GRND REM W CRD (Procedure Accessories) ×2 IMPLANT
PAD PREP CUFF 24X41IN W 9IN (Prep) ×2 IMPLANT
PAD SANITARY L12.25 IN X W4.25 IN HEAVY ABSORBENT MOISTURE BARRIER (Dressing) ×1 IMPLANT
PAD SNTR SLK FLF CRTY 12.25X4.25IN LF NS (Dressing) ×2
SHEET LAPAROTMY TRNSVRS 72X119 (Drape) ×2 IMPLANT
SOL IRR 0.9% NACL 500ML PLS PR BTL ISTNC (Irrigation Solutions) ×1
SOLUTION IRRIGATION 0.9% SDM CHLORIDE 500ML PR BTTL ISOTONIC NONPRGNC (Irrigation Solutions) ×1 IMPLANT
SOLUTION IRRIGATION 0.9% SODIUM CHLORIDE (Irrigation Solutions) ×1
SPONGE HEMOSTATIC SURGICEL 4X8 (Dressing) IMPLANT
STOCKING CMPR NYL MED REG THG LGTH TED (Patient Supply) ×2
STOCKING COMPRESSION MEDIUM REGULAR THIGH LENGTH 2 PLY HEEL POCKET TED (Patient Supply) ×1 IMPLANT
STRIP SKIN CLOSURE L4 IN X W1/2 IN (Dressing) ×2
STRIP SKIN CLOSURE L4 IN X W1/2 IN REINFORCE STERI-STRIP POLYESTER (Dressing) ×2 IMPLANT
STRIP SKNCLS PLSTR STRSTRP 4X.5IN LF (Dressing) ×2
SUTURE ABS 2-0 CT1 VCL 27IN BRD COAT (Suture) ×1
SUTURE ABS 4-0 KS COAT VCL 27IN BRD UD (Suture)
SUTURE ABS PLN 2-0 XLH 27IN MFL (Suture)
SUTURE ABS PLN 3-0 CT 27IN MFL YELLOWISH (Suture) ×1
SUTURE COATED VICRYL 2-0 CT-1 L27 IN (Suture) ×1
SUTURE COATED VICRYL 2-0 CT-1 L27 IN BRAID COATED VIOLET ABSORBABLE (Suture) ×1 IMPLANT
SUTURE COATED VICRYL 4-0 KS L27 IN BRAID (Suture)
SUTURE COATED VICRYL 4-0 KS L27 IN BRAID UNDYED ABSORBABLE (Suture) IMPLANT
SUTURE PLAIN GUT PLAIN 2-0 XLH L27 IN (Suture) IMPLANT
SUTURE PLAIN GUT PLAIN 3-0 CT L27 IN (Suture) ×1
SUTURE PLAIN GUT PLAIN 3-0 CT L27 IN MONOFILAMENT YELLOWISH TAN (Suture) ×1 IMPLANT
SUTURE VICRYL 0 CT1 36IN (Suture) ×6 IMPLANT
SUTURE VICRYL 0 CT1 8X27IN (Suture) ×2 IMPLANT
TOWEL L26 IN X W17 IN COTTON PREWASH (Procedure Accessories) ×2
TOWEL L26 IN X W17 IN COTTON PREWASH DELINT BLUE ACTISORB SURGICAL (Procedure Accessories) ×2 IMPLANT
TOWEL SRG CTTN 26X17IN LF STRL PREWASH (Procedure Accessories) ×2
TRAY LAPAROTOMY (Pack) ×2 IMPLANT
TRAY SKIN BETANDINE PREP (Tray) ×2 IMPLANT
UNDRGLOV SZ8.5 PI INDICATR BLU (Glove) ×2 IMPLANT
WATER STERILE PLASTIC POUR BOTTLE 1000 (Irrigation Solutions) ×1
WATER STERILE PLASTIC POUR BOTTLE 1000 ML (Irrigation Solutions) ×1 IMPLANT
WATER STRL 1000ML PLS PR BTL LF (Irrigation Solutions) ×1

## 2013-11-01 NOTE — Anesthesia Postprocedure Evaluation (Signed)
Anesthesia Transfer of Care Note    Patient: Kaitlyn Wilcox    Procedures performed: Procedure(s) with comments:  HYSTERECTOMY, ABDOMINAL, TOTAL - right salpingectomy    Anesthesia type: General ETT    Patient location:Phase I PACU    Last vitals:   Filed Vitals:    11/01/13 1400   BP: 158/85   Pulse: 65   Temp:    Resp: 23   SpO2: 97%       Post pain: Patient not complaining of pain, continue current therapy     Mental Status:awake    Respiratory Function: tolerating NC O2     Cardiovascular: stable    Nausea/Vomiting: patient not complaining of nausea or vomiting    Hydration Status: adequate    Post assessment: no apparent anesthetic complications, no reportable events and no evidence of recall

## 2013-11-01 NOTE — Op Note (Signed)
Procedure Date: 11/01/2013     Patient Type: A     SURGEON:   ASSISTANT:       PREOPERATIVE DIAGNOSIS:  Adenomyosis and dysfunctional uterine bleeding.     POSTOPERATIVE DIAGNOSIS:  Adenomyosis and dysfunctional uterine bleeding.     TITLE OF PROCEDURE:  Total abdominal hysterectomy and right salpingectomy.     DESCRIPTION OF PROCEDURE:  The patient was given general anesthesia by the anesthesia department,  prepped and draped in sterile fashion.  Transverse incision made into the  abdomen, going through the various anatomical layers.  Upon opening the  peritoneal cavity, we found a 12 week-sized enlarged uterus that was soft  and boggy, consistent with adenomyosis.  Hysterectomy then performed in the  following fashion.     Round ligaments were clamped, cut, and ligated with 0 Vicryl suture.  A  bladder flap was developed by cutting transversely between the round  ligaments and pushing down with blunt and sharp dissection.  The patient  had a previous cesarean section and had a great deal of adhesions in that  anterior area, so we were careful to dissect it.  The ovarian ligaments and  tube were clamped, cut, and ligated with 0 Vicryl suture.  The uterine  arteries, the cardinal ligaments and uterosacral ligaments clamped, cut,  and ligated with 0 Vicryl sutures.       First, a supracervical hysterectomy was performed and then the cervical  stump was removed and the vaginal cuff was brought to hemostasis with  figure-of-eight 0 Vicryl sutures.  We also took off the left tube by  clamping, cutting and ligating with 0 Vicryl suture.  The abdomen was then  cleaned of its bloody contents and after report of lap and sponge count and  instrument count said to be correct, the abdomen was closed in the  following layers, the peritoneum with continuous 0 Vicryl suture, the  fascia with continuous 0 Vicryl suture, the subcutaneous with 3-0 plain  suture, and the skin with a 4-0 Vicryl subcuticular stitch.     The patient  lost about 400 mL of blood during the procedure and received  156 mL back by way of the Cell Saver.  She tolerated the procedure well and  was taken to recovery room in satisfactory condition.           D:  11/01/2013 13:50 PM by Dr. Denny Levy, MD [phone])  T:  11/01/2013 17:25 PM by       Everlean Cherry: [id number]) (Doc ID: [id number])

## 2013-11-01 NOTE — Addendum Note (Signed)
Addendum  created 11/01/13 1810 by Gardiner Rhyme, MD    Modules edited:Orders, PRL Based Order Sets

## 2013-11-01 NOTE — Transfer of Care (Signed)
Anesthesia Transfer of Care Note    Patient: Kaitlyn Wilcox    Procedures performed: Procedure(s) with comments:  HYSTERECTOMY, ABDOMINAL, TOTAL - right salpingectomy    Anesthesia type: General ETT    Patient location:Phase I PACU    Last vitals:   Filed Vitals:    11/01/13 1340   BP: 138/76   Pulse: 57   Temp: 97.6 F (36.4 C)   Resp: 16   SpO2: 98%       Post pain: Patient not complaining of pain, continue current therapy      Mental Status:alert  and drowsy    Respiratory Function: tolerating face mask    Cardiovascular: stable    Nausea/Vomiting: patient not complaining of nausea or vomiting    Hydration Status: adequate    Post assessment: no apparent anesthetic complications, no reportable events and no evidence of recall

## 2013-11-01 NOTE — PACU (Signed)
Patient appears to be sleeping and comfortable.

## 2013-11-01 NOTE — Brief Op Note (Signed)
BRIEF OP NOTE    Date Time: 11/01/2013 1:41 PM    Patient Name:   Kaitlyn Wilcox    Date of Operation:   11/01/2013    Providers Performing:   Denny Levy    Assistant (s):   House staff    Operative Procedure:   Procedure(s):  HYSTERECTOMY, ABDOMINAL, TOTAL    Preoperative Diagnosis:   Pre-Op Diagnosis Codes:     * Endometriosis of uterus [617.0]     * Other disorder of menstruation and other abnormal bleeding from female genital tract [626.8]    Postoperative Diagnosis:   Endometriosis of uterus [617.0]Other disorder of menstruation and other abnormal bleeding from female genital tract [626.8][    Anesthesia:   General    Estimated Blood Loss:    400 cc    Specimens:        SPECIMENS (last 24 hours)      Pathology Specimens     Row Name 11/01/13 1300             Additional Information    Clinical Information adenomyosis and dub     Send final report to: Giankarlo Leamer     Specimen Information    Specimen Testing Required Routine Pathology     Specimen ID  a     Specimen Description uterus cervix right tube         Findings:   12 week enlarged uterus    Complications:   None      Signed by: Denny Levy, MD                                                                           [facility]

## 2013-11-01 NOTE — H&P (Signed)
ADMISSION HISTORY AND PHYSICAL EXAM    Date Time: 11/01/2013 7:38 AM  Patient Name: Kaitlyn Wilcox  Attending Physician: Denny Levy, MD    History of Present Illness:   Kaitlyn Wilcox is a 42 y.o. female who presents to the hospital for hysterectomy for large  Troublesome fibroid utrus.    Past Medical History:     Past Medical History   Diagnosis Date   . Asthma without status asthmaticus    . Pulmonary embolism 2009   . Cervical dysplasia    . Post-operative nausea and vomiting      apfel 3       Past Surgical History:     Past Surgical History   Procedure Date   . Total ankle replacement 2012, 2013     right   . Fibula fracture surgery      right   . D & c, hysteroscopy 10/18/2013     Procedure: D & C, HYSTEROSCOPY;  Surgeon: Denny Levy, MD;  Location: [facility];  Service: Gynecology;  Laterality: N/A;       Family History:     Family History   Problem Relation Age of Onset   . Malignant hyperthermia Neg Hx    . Pseudochol deficiency Neg Hx    . Anesthesia problems Neg Hx        Social History:     History     Social History   . Marital Status: Single     Spouse Name: N/A     Number of Children: N/A   . Years of Education: N/A     Social History Main Topics   . Smoking status: Never Smoker    . Smokeless tobacco: Not on file   . Alcohol Use: 1.8 oz/week     3 Glasses of wine per week   . Drug Use:    . Sexually Active:      Other Topics Concern   . Not on file     Social History Narrative   . No narrative on file       Allergies:     Allergies   Allergen Reactions   . Dilaudid (Hydromorphone Hcl) Other (See Comments)     "stop breathing"   . Penicillins Anaphylaxis   . Tree Nuts Anaphylaxis   . Latex Swelling     Swelling-contact   . Nuts (Peanut Oil) Rash   . Percocet (Oxycodone-Acetaminophen) Hives       Medications:   See Medication List    Review of Systems:   General ROS: negative  Psychological ROS: negative  HEENT ROS: negative  Hematological and Lymphatic ROS:  negative  Endocrine ROS: negative  Breast ROS: negative for breast lumps  Respiratory ROS: no cough, shortness of breath, or wheezing  Cardiovascular ROS: no chest pain or dyspnea on exertion  Gastrointestinal ROS: no abdominal pain, change in bowel habits, or black or bloody stools  Genito-Urinary ROS: no dysuria, trouble voiding, or hematuria  Musculoskeletal ROS: negative  Neurological ROS: no TIA or stroke symptoms  Dermatological ROS: negative    Physical Exam:   There were no vitals filed for this visit.      No intake or output data in the 24 hours ending 11/01/13 0738    General appearance - alert, well appearing, and in no distress  Mental status - alert, oriented to person, place, and time  Neck - supple, no significant adenopathy  Lymphatics - no palpable lymphadenopathy, no hepatosplenomegaly  Chest - clear to auscultation, no wheezes, rales or rhonchi, symmetric air entry  Heart - normal rate, regular rhythm, normal S1, S2, no murmurs, rubs, clicks or gallops  Abdomen - soft, nontender, nondistended, no masses or organomegaly  Breasts - breasts appear normal, no suspicious masses, no skin or nipple changes or axillary nodes  Pelvic - normal external genitalia, vulva, vagina, cervix, large fibroid uterus and adnexa  Rectal - normal rectal, no masses  Back exam - full range of motion, no tenderness, palpable spasm or pain on motion  Neurological - alert, oriented, normal speech, no focal findings or movement disorder noted  Musculoskeletal - no joint tenderness, deformity or swelling  Extremities - peripheral pulses normal, no pedal edema, no clubbing or cyanosis  Skin - normal coloration and turgor, no rashes, no suspicious skin lesions noted    Labs:     Results     Procedure Component Value Units Date/Time    OUTSIDE LAB SCAN [[id number]] Resulted:10/31/13 1627     Updated:10/31/13 1627              Rads:   Radiological Procedure reviewed.      Assessment:   Large fibroid    Plan:    Hysterectomy        Denny Levy, MD  11/01/2013  7:38 AM

## 2013-11-01 NOTE — Progress Notes (Signed)
Pain not controled with using PCA. Called anesthesia, will see the pt.

## 2013-11-01 NOTE — Plan of Care (Signed)
Problem: Safety  Goal: Patient will be free from injury during hospitalization  Outcome: Progressing  Hourly rounding maintained. Bed to lower position. Call light and belonging at the bedside. Advice to call for help before getting up.     Problem: Pain  Goal: Patient's pain/discomfort is manageable  Outcome: Progressing  Pt on fentanyl PCA. 9/10 incisional pain, but sleepy on and off. Easy to arouse. Instruction given how to use PCA and side effects. Pt given scheduled Toradol.     Comments:   Admitted from PACU. Pt drowsy. Family at the bedside. Pt and family oriented to the room. Call light in reach. Abdominal incision dressing clean/dry and intact. Foley in place and to gravity with greenish color clear urin. We will closely monitor the pt.

## 2013-11-01 NOTE — Anesthesia Preprocedure Evaluation (Signed)
Anesthesia Evaluation    AIRWAY    Mallampati: III    TM distance: >3 FB  Neck ROM: full  Mouth Opening:full   CARDIOVASCULAR    cardiovascular exam normal, regular and normal       DENTAL         PULMONARY    pulmonary exam normal     OTHER FINDINGS                      Anesthesia Plan    ASA 3     general               (Obese. Previously, multiple asthma exacerbations including peri-operatively. )      intravenous induction   Detailed anesthesia plan: general endotracheal      Post Op: other  Post op pain management: PCA    informed consent obtained    Plan discussed with CRNA.

## 2013-11-01 NOTE — Addendum Note (Signed)
Addendum  created 11/01/13 1811 by Gardiner Rhyme, MD    Modules edited:Orders

## 2013-11-01 NOTE — Progress Notes (Signed)
pca switched to morphine by MD. We will continue to monitor the pt.

## 2013-11-02 ENCOUNTER — Encounter: Payer: Self-pay | Admitting: Obstetrics & Gynecology

## 2013-11-02 ENCOUNTER — Encounter: Payer: Self-pay | Admitting: Anesthesiology

## 2013-11-02 LAB — CBC AND DIFFERENTIAL
Basophils Absolute Automated: 0.01 (ref 0.00–0.20)
Basophils Automated: 0 %
Eosinophils Absolute Automated: 0 (ref 0.00–0.70)
Eosinophils Automated: 0 %
Hematocrit: 33.1 % — ABNORMAL LOW (ref 37.0–47.0)
Hgb: 10.3 g/dL — ABNORMAL LOW (ref 12.0–16.0)
Immature Granulocytes Absolute: 0.04
Immature Granulocytes: 0 %
Lymphocytes Absolute Automated: 1.32 (ref 0.50–4.40)
Lymphocytes Automated: 9 %
MCH: 22 pg — ABNORMAL LOW (ref 28.0–32.0)
MCHC: 31.1 g/dL — ABNORMAL LOW (ref 32.0–36.0)
MCV: 70.6 fL — ABNORMAL LOW (ref 80.0–100.0)
MPV: 9.5 fL (ref 9.4–12.3)
Monocytes Absolute Automated: 0.89 (ref 0.00–1.20)
Monocytes: 6 %
Neutrophils Absolute: 12.32 — ABNORMAL HIGH (ref 1.80–8.10)
Neutrophils: 85 %
Nucleated RBC: 0 (ref 0–1)
Platelets: 335 (ref 140–400)
RBC: 4.69 (ref 4.20–5.40)
RDW: 15 % (ref 12–15)
WBC: 14.54 — ABNORMAL HIGH (ref 3.50–10.80)

## 2013-11-02 MED ORDER — ONDANSETRON HCL 4 MG/2ML IJ SOLN
4.0000 mg | Freq: Once | INTRAMUSCULAR | Status: AC | PRN
Start: 2013-11-02 — End: 2013-11-02

## 2013-11-02 MED ORDER — MORPHINE PCA 1MG/ML IN 100 ML NS-OUTSOURCED
INTRAVENOUS | Status: DC
Start: 2013-11-02 — End: 2013-11-02

## 2013-11-02 MED ORDER — LEVOFLOXACIN IN D5W 750 MG/150ML IV SOLN
750.0000 mg | Freq: Once | INTRAVENOUS | Status: AC
Start: 2013-11-02 — End: 2013-11-02
  Administered 2013-11-02: 750 mg via INTRAVENOUS
  Filled 2013-11-02: qty 150

## 2013-11-02 MED ORDER — METOCLOPRAMIDE HCL 5 MG/ML IJ SOLN
10.0000 mg | Freq: Once | INTRAMUSCULAR | Status: AC | PRN
Start: 2013-11-02 — End: 2013-11-02

## 2013-11-02 MED ORDER — ACETAMINOPHEN 325 MG PO TABS
650.0000 mg | ORAL_TABLET | Freq: Four times a day (QID) | ORAL | Status: DC
Start: 2013-11-02 — End: 2013-11-04
  Administered 2013-11-02 – 2013-11-04 (×4): 650 mg via ORAL
  Filled 2013-11-02 (×4): qty 2

## 2013-11-02 MED ORDER — MORPHINE PCA 1MG/ML IN 100 ML NS-OUTSOURCED
INTRAVENOUS | Status: AC
Start: 2013-11-02 — End: 2013-11-02
  Filled 2013-11-02: qty 100

## 2013-11-02 MED ORDER — LEVALBUTEROL HCL 0.63 MG/3ML IN NEBU
0.6300 mg | INHALATION_SOLUTION | Freq: Four times a day (QID) | RESPIRATORY_TRACT | Status: DC | PRN
Start: 2013-11-02 — End: 2013-11-04
  Administered 2013-11-02 – 2013-11-04 (×6): 0.63 mg via RESPIRATORY_TRACT
  Filled 2013-11-02 (×5): qty 1

## 2013-11-02 MED ORDER — NALOXONE HCL 0.4 MG/ML IJ SOLN
0.1000 mg | INTRAMUSCULAR | Status: DC | PRN
Start: 2013-11-02 — End: 2013-11-03

## 2013-11-02 MED ORDER — MORPHINE PCA 1MG/ML IN 100 ML NS-OUTSOURCED
INTRAVENOUS | Status: DC
Start: 2013-11-02 — End: 2013-11-03
  Administered 2013-11-02 (×2): 100 mg via INTRAVENOUS

## 2013-11-02 MED ORDER — DIPHENHYDRAMINE HCL 50 MG/ML IJ SOLN
12.5000 mg | Freq: Four times a day (QID) | INTRAMUSCULAR | Status: DC | PRN
Start: 2013-11-02 — End: 2013-11-03

## 2013-11-02 NOTE — Progress Notes (Signed)
Pt is POD 1 from hysterectomy. PCA changed from fentanyl to morphine last night. She reports pain is 5-6/10 at baseline, peaking to "above 10" at worst. She has used 52mg  morphine in 15 hrs. Currently she is getting 2mg  morphine q10 min; will decrease lockout to 6 min to allow more frequent dosing.

## 2013-11-02 NOTE — Plan of Care (Signed)
Problem: Safety  Goal: Patient will be free from injury during hospitalization  Outcome: Progressing  Pt AOX3. Call bell, phone, and personal belongings at bedside, bed in lowest position. Hourly rounding completed. Pt demonstrates proper use of call bell for assistance PRN.      Problem: Pain  Goal: Patient's pain/discomfort is manageable  Outcome: Progressing  Pt pain only moderately controlled. Dosage frequency changed per anesthesia order on PCA this morning. Pt became very drowsy. Pt pain has since improved but screams in pain upon any movement and c/o intermittent sharp pains. Pain ranges anywhere from 6-9/10. CFG 3/10.    Comments:   Status: AOX3. Pain only moderately controlled (see above). Drsg to lower abdomen with gauze CDI. -flatus, LBM PTA. Scant amounts of serosanguinous drainage on peri-pad. Foley d/c'd at 1500, small amount of urine passed. Short bout of nausea earlier today with lunch, phenergan given with adequate relief.     Plan: Continue to round hourly and assist PRN. Encourage fluids and IS. Monitor pain.

## 2013-11-02 NOTE — Progress Notes (Signed)
Pt is S/P total abdominal hysterectomy, currently on PCA morphine and toradol to manage pain. Pt stated that pain was not tolerable. Received ordered dose of 4 mg IV morphine with good relief. At 0430, pt complained of severe abdominal pain at the incision site. Medicated with Toradol, Anesthesia notified, ordered to give Norco and increase the morphine to 2mg . Pt continued to complain of abdominal pain. Dr. Higinio Plan, house doctor, is notified to assess the pt. 0515:  Pt appeared to be sleeping comfortably.

## 2013-11-02 NOTE — Progress Notes (Signed)
Patient complains of a lot of pain today and is receiving different pain medications. Her vital signs are stable  And her blood count is adequate. Her white blood count is elevated and additional antibiotocs will be given.

## 2013-11-03 MED ORDER — HYDROCODONE-ACETAMINOPHEN 5-325 MG PO TABS
2.0000 | ORAL_TABLET | ORAL | Status: DC | PRN
Start: 2013-11-03 — End: 2013-11-04
  Administered 2013-11-03 – 2013-11-04 (×7): 2 via ORAL
  Filled 2013-11-03 (×7): qty 2

## 2013-11-03 MED ORDER — ONDANSETRON HCL 4 MG/2ML IJ SOLN
4.0000 mg | Freq: Once | INTRAMUSCULAR | Status: AC
Start: 2013-11-03 — End: 2013-11-03
  Administered 2013-11-03: 4 mg via INTRAVENOUS
  Filled 2013-11-03: qty 2

## 2013-11-03 MED ORDER — BISACODYL 10 MG RE SUPP
20.0000 mg | Freq: Once | RECTAL | Status: AC
Start: 2013-11-03 — End: 2013-11-03
  Administered 2013-11-03: 20 mg via RECTAL
  Filled 2013-11-03: qty 2

## 2013-11-03 MED ORDER — BISACODYL 10 MG RE SUPP
20.0000 mg | Freq: Once | RECTAL | Status: DC
Start: 2013-11-03 — End: 2013-11-03

## 2013-11-03 MED ORDER — IBUPROFEN 400 MG PO TABS
800.0000 mg | ORAL_TABLET | Freq: Once | ORAL | Status: AC
Start: 2013-11-03 — End: 2013-11-03
  Administered 2013-11-03: 800 mg via ORAL
  Filled 2013-11-03: qty 2

## 2013-11-03 NOTE — Plan of Care (Signed)
Problem: Pain  Goal: Patient's pain/discomfort is manageable  Outcome: Progressing  VSS. C/O pain 7/10. Medicated with vicodin Vicodin 2 tabs 1hr prior to d/ced PCA. Scheduled Toradol also given. OOB to BR with assist. Passed a lot of flatus per mother after the suppository. Also walked in the halls, first c/o feeling dizziness after walking which resolved after getting back in bed. Pt instructed to moved slowly and let the nurse know if dizziness reoccurs. 2nd walk pt tolerated well per pt and mother except pain and slight nausea. Pt stated phenergan made her very drowsy yesterday. Dr. [place] Lodge informed of pt progress, ie positive, flatus, nausea and unrelieved pain. Orders for motrin, zofran and consult to Pain management RN given and carried out. Message left to X [phone] (pain management) Incentive spirometer was taught this am and encouraged, SCDs on and lovenox given. Incision to low abd covered with dressing which is CDI. Currently pt is sleeping. Will continue to monitor VS, pain and follow the plan of care. Possible d/c home today when pt and nausea manageable.

## 2013-11-03 NOTE — Progress Notes (Signed)
Patient arrived to unit from unit 26 with PCA morphine.  Rated pain between 5-6 encouraged to pressed button when lights turn green verbalizes of understanding.  Dressings in the pubis region condition clean, dry, and intact.

## 2013-11-03 NOTE — Plan of Care (Addendum)
Now rates her pain at 4/10. Denies nausea at this time. States that she does not not feel comfortable going home today.Will continue to monitor VS, Pain and follow the plan of care. Dr. [place] Lodge was informed of pt's wishes to go home tomorrow. States he will see her tomorrow. Pt informed.

## 2013-11-03 NOTE — Progress Notes (Addendum)
Anesthesia Pain Evaluation    Patient: Kaitlyn Wilcox    Procedures performed: Procedure(s) with comments:  HYSTERECTOMY, ABDOMINAL, TOTAL - right salpingectomy    Anesthesia type: General ETT    Patient location:[facility]    Last vitals:   Filed Vitals:    11/03/13 0659   BP: 130/61   Pulse: 74   Temp: 97.5 F (36.4 C)   Resp: 16   SpO2: 97%       Post pain: Patient not complaining of pain, continue current therapy      Mental Status:awake    Respiratory Function: tolerating room air    Cardiovascular: stable    Nausea/Vomiting: patient not complaining of nausea or vomiting    Hydration Status: adequate    Post assessment: no apparent anesthetic complications and no reportable events    Plan: pain score is 5/10, 10 doses in last 8 hours, patient is comfortable, ready transition to PO pain meds

## 2013-11-04 NOTE — Plan of Care (Signed)
See Care Notes

## 2013-11-04 NOTE — Progress Notes (Signed)
CM met w/pt today to discuss d/c plans. Pt plans to d/c to home w/her mother today. Pt had previously been supplied a rolling walker from the supply closet to assist w/her recovery. CM and CCM, Sophia, discussed further d/c plans home with the pt such as ambulating around the house. CCM advised the pt not to strain herself and walk up the stairs to her room, CCM advised the pt to remain on the main floor for a couple of days to recover from her surgery.     Pt has a scheduled appointment w/her surgeon next Thursday.     No further CM needs.     Wendi Maya, MSW  Ext. [phone]

## 2013-11-04 NOTE — Discharge Instructions (Signed)
Date Time: 11/04/2013 11:42 AM  Attending Physician: Denny Levy, MD    Date of Admission:   11/01/2013    Reason for Admission:   Endometriosis of uterus [617.0] (Adenomyosis)  Other disorder of menstruation and other abnormal bleeding from female genital tract [626.8] (DUB)  Fibroid  Fibroid  Fibroid    Follow up:        Neurology (If Stroke): ______________________  Phone:______________________    Other:___________________________________   Phone: ______________________    If you are taking Warfarin, please follow up with (health professional/clinic)N/A____ on ___N/A____to have your PT/INR blood test checked.          Medications:    Your medications have been listed for you on the Medication Reconciliation Discharge Home List. Please bring a copy of all discharge instructions, including your Medication Reconciliation Discharge Home List when you visit your physician.      Continue taking all medications even if you feel well, unless otherwise instructed by physician.    Do not take any over-the-counter medications or herbal supplements without checking with your pharmacist or doctor.     Activity:    Rise slowly from a sitting or lying position. Increase activity slowly, unless otherwise instructed by physician.    Perform exercises as desginated by Therapist and Physician.    In the event of severe shortness of breath or chest discomfort, call 911. Do NOT drive to the hospital.    Speak with your physician regarding specific driving and/or work restrictions.    Diet:    If you have special diet orders, you have been given printed diet instructions.   ________________________________________________________________________    Tobacco Cessation Counseling:  If you are currently a tobacco user or have used tobacco within the last 12 months, we have provided you with written Tobacco Cessation Counseling.  ________________________________________________________________________    Heart Failure Education:  If  you have a diagnosis of a Heart Failure, we have provided you with written Heart Failure Education.     Weigh yourself once a day at the same time. Record and bring the weight record to your next physician appointment.     Call your doctor if you gain more than 3 pounds in one day or 5 pounds in one week, or if you experience shortness of breath, leg swelling and/or chest discomfort.     Enroll in the [facility] Tel-Assurance Program, a heart failure patient support program. Call [phone] for additional details.   ________________________________________________________________________    Diamond Nickel Education:    Call 911 for:    Sudden numbness or weakness of the face    Sudden numbness of the arm or leg especially one side of the body    Sudden confusion, trouble speaking or understanding    Sudden trouble seeing in one or both eyes    Sudden trouble walking or dizziness, loss of balance or coordination        For promotion of your health, we have provided you with personalized written education on risk factors specific to your diagnosis, including but not limited to:    High Blood Pressure    High Cholesterol    Atrial Fibrillation    Overweight    Diabetes    Smoking         Vaccinations  Pneumonia Vaccine Received on:  Flu Vaccine Received on:             Treatments/Special Instructions:      Discharge Instructions for Abdominal Hysterectomy  You had a  procedure called abdominal hysterectomy, a surgery to remove your uterus. This can relieve such problems as severe pain and bleeding. It usually takes from4-6weeks to recover from abdominal hysterectomy. Remember, though, that recovery time varies from woman to woman.    When to Call Your Doctor  Call your doctor immediately if you have any of the following:   Fever above101.17F   Chills   Bright red vaginal bleeding or vaginal bleeding that  soaks more than one pad per hour   A smelly discharge from the vagina   Trouble urinating or burning when you  urinate   Severe pain or bloating in your abdomen   Redness, swelling, or drainage at your incision site   Home Care   Don't drive for at [id number] after the operation. Don't drive while you are still taking narcotic pain medications.   Ask others to help with chores and errands while you recover.   Don't lift anything heavier than10 pounds for6weeks.   Don't vacuum or do other strenuous activities until the doctor says it's okay.   Walk as often as you feel able.   Try to avoid climbing stairs for the first2weeks. When you must climb stairs, go slowly and pause after every few steps.   Continue the coughing and deep breathing exercises that you learned in the hospital.   Avoid constipation.   Eat fruits, vegetables, and whole grains.   Drink6-8 glasses of water a day, unless directed otherwise.   Use a laxative or a mild stool softener if your doctor says it's okay.   Shower as usual. Wash your incision with mild soap and water. Do not scrub the incision to clean it. Pat it dry.   Don't use oils, powders, or lotions on your incision.   Don't put anything in your vagina until your doctor says it's safe to do so. Don't use tampons or douches. Don't have sexual intercourse. Donot do anyof these things for6weeks.   If you had both ovaries removed, report hot flashes, mood swings, and irritability to your doctor. There may be medications that can help you.  Follow-Up   Make a follow-up appointment as directed by our staff.   Ask your doctor when you can return to work.   [address], [place], [place] [address]. All rights reserved. This information is not intended as a substitute for professional medical care. Always follow your healthcare professional's instructions.                  Signed by: Presley Raddle, RN    I HAVE RECEIVED AND UNDERSTAND THESE DISCHARGE INSTRUCTIONS.

## 2013-11-04 NOTE — Progress Notes (Signed)
Pt here for abdominal hysterectomy. Continues to complain of pain, very anxious. Went over medication reconciliation and medication safety, as well as post op instructions. Pt verbalized her understanding. IV removed. Mother is here at the bedside. Pt went home in wheelchair. PNA vaccine given, pt already had flu vaccine.

## 2013-11-04 NOTE — Plan of Care (Signed)
Problem: Pain  Goal: Patient's pain/discomfort is manageable  Outcome: Progressing  Pt complains of severe pain in abdominal area, requested pain medication. Acetaminophen daily dose limit had been reached, pt educated on medication safety/toxicity; however pt stated she is aware of the risks, insists to have pain medication. Held scheduled Tylenol. Abdominal binder is in place, pt has pain with movement.

## 2013-11-04 NOTE — Progress Notes (Signed)
Patient doing better today. Her pain has decreased and she is now passing gas. Patient for discharge home today and will  Be seen in follow up next week. Final diagnosis Adenomyosis, Fibroid uterus for which she had successful hysterectomy.

## 2013-11-04 NOTE — Plan of Care (Signed)
Problem: Pain  Goal: Patient's pain/discomfort is manageable  Outcome: Progressing  Alert & oriented x 4 while awake. Pt's mother staying overnight in room. Pt walking to & from bathroom using walker. Abd binder in place while  iIn bed. Medicated with scheduled & prn pain medications for abd pain s/p TAH. DDI on lower abd. Encouraged to use Incentive Spirometer  frequently while awake. Pt reporting less pain with ambulation when medicated at 0200.  Verbalized desire to go home today. Call bell at hand.  Hourly rounds.

## 2013-11-08 NOTE — Discharge Summary (Signed)
Discharge Date: 11/04/2013     ATTENDING PHYSICIAN:  Denny Levy, MD     FINAL DIAGNOSES:  1.  Adenomyosis.  2.  Postoperative ileus.     PROCEDURE PERFORMED:  Total abdominal hysterectomy.  The patient underwent surgery on November 01, 2013, for successful removal of her uterus.     HOSPITAL COURSE:  Her postoperative course was marred by more than usual pain, which required  her to take more than usual amounts of pain medications.  For this reason,  she developed a postoperative ileus, which required conservative measures  to help relieve.  She was discharged in satisfactory condition on December  12.  She will be followed in the office in 1 week.           D:  11/08/2013 08:38 AM by Dr. Denny Levy, MD [phone])  T:  11/08/2013 15:11 PM by       Everlean Cherry: [id number]) (Doc ID: [id number])

## 2014-11-24 DIAGNOSIS — Z8614 Personal history of Methicillin resistant Staphylococcus aureus infection: Secondary | ICD-10-CM

## 2014-11-24 HISTORY — PX: OTHER SURGICAL HISTORY: SHX169

## 2014-11-24 HISTORY — DX: Personal history of Methicillin resistant Staphylococcus aureus infection: Z86.14

## 2016-09-02 ENCOUNTER — Other Ambulatory Visit: Payer: Self-pay | Admitting: Obstetrics & Gynecology

## 2016-09-02 DIAGNOSIS — Z1231 Encounter for screening mammogram for malignant neoplasm of breast: Secondary | ICD-10-CM

## 2016-09-02 DIAGNOSIS — R102 Pelvic and perineal pain: Secondary | ICD-10-CM

## 2016-09-10 ENCOUNTER — Ambulatory Visit: Payer: BLUE CROSS/BLUE SHIELD

## 2018-02-22 DIAGNOSIS — J329 Chronic sinusitis, unspecified: Secondary | ICD-10-CM

## 2018-02-22 DIAGNOSIS — H9312 Tinnitus, left ear: Secondary | ICD-10-CM

## 2018-02-22 HISTORY — DX: Tinnitus, left ear: H93.12

## 2018-02-22 HISTORY — DX: Chronic sinusitis, unspecified: J32.9

## 2018-03-01 ENCOUNTER — Telehealth: Payer: Commercial Managed Care - HMO

## 2018-03-01 NOTE — Pre-Procedure Instructions (Signed)
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

## 2018-03-05 ENCOUNTER — Ambulatory Visit: Payer: Commercial Managed Care - HMO | Admitting: Certified Registered"

## 2018-03-05 ENCOUNTER — Encounter
Admission: RE | Disposition: A | Discharge status: Home / Self Care | Payer: Self-pay | Source: Ambulatory Visit | Attending: Gastroenterology

## 2018-03-05 ENCOUNTER — Ambulatory Visit: Payer: Self-pay

## 2018-03-05 ENCOUNTER — Ambulatory Visit
Admission: RE | Admit: 2018-03-05 | Discharge: 2018-03-05 | Disposition: A | Discharge status: Home / Self Care | Payer: Commercial Managed Care - HMO | Source: Ambulatory Visit | Attending: Gastroenterology | Admitting: Gastroenterology

## 2018-03-05 DIAGNOSIS — D219 Benign neoplasm of connective and other soft tissue, unspecified: Secondary | ICD-10-CM

## 2018-03-05 DIAGNOSIS — D649 Anemia, unspecified: Secondary | ICD-10-CM | POA: Insufficient documentation

## 2018-03-05 DIAGNOSIS — K210 Gastro-esophageal reflux disease with esophagitis: Secondary | ICD-10-CM | POA: Insufficient documentation

## 2018-03-05 DIAGNOSIS — Z9889 Other specified postprocedural states: Secondary | ICD-10-CM

## 2018-03-05 DIAGNOSIS — K921 Melena: Secondary | ICD-10-CM | POA: Insufficient documentation

## 2018-03-05 DIAGNOSIS — R1030 Lower abdominal pain, unspecified: Secondary | ICD-10-CM

## 2018-03-05 DIAGNOSIS — N938 Other specified abnormal uterine and vaginal bleeding: Secondary | ICD-10-CM

## 2018-03-05 DIAGNOSIS — K228 Other specified diseases of esophagus: Secondary | ICD-10-CM | POA: Insufficient documentation

## 2018-03-05 DIAGNOSIS — R12 Heartburn: Secondary | ICD-10-CM | POA: Insufficient documentation

## 2018-03-05 DIAGNOSIS — R1110 Vomiting, unspecified: Secondary | ICD-10-CM | POA: Insufficient documentation

## 2018-03-05 DIAGNOSIS — J45909 Unspecified asthma, uncomplicated: Secondary | ICD-10-CM | POA: Insufficient documentation

## 2018-03-05 DIAGNOSIS — K2950 Unspecified chronic gastritis without bleeding: Secondary | ICD-10-CM

## 2018-03-05 DIAGNOSIS — K2270 Barrett's esophagus without dysplasia: Secondary | ICD-10-CM

## 2018-03-05 HISTORY — PX: EGD, BIOPSY: SHX3796

## 2018-03-05 SURGERY — ESOPHAGOGASTRODUODENOSCOPY (EGD), BIOPSY
Anesthesia: Anesthesia General | Site: Mouth | Wound class: Clean Contaminated

## 2018-03-05 MED ORDER — KETAMINE HCL 50 MG/ML IJ SOLN
INTRAMUSCULAR | Status: DC | PRN
Start: 2018-03-05 — End: 2018-03-05
  Administered 2018-03-05: 20 mg via INTRAVENOUS

## 2018-03-05 MED ORDER — LABETALOL HCL 5 MG/ML IV SOLN (WRAP)
INTRAVENOUS | Status: AC
Start: 2018-03-05 — End: ?
  Filled 2018-03-05: qty 20

## 2018-03-05 MED ORDER — LIDOCAINE HCL (PF) 2 % IJ SOLN
INTRAMUSCULAR | Status: AC
Start: 2018-03-05 — End: ?
  Filled 2018-03-05: qty 5

## 2018-03-05 MED ORDER — LACTATED RINGERS IV SOLN
INTRAVENOUS | Status: DC
Start: 2018-03-05 — End: 2018-03-05

## 2018-03-05 MED ORDER — PROPOFOL 10 MG/ML IV EMUL (WRAP)
INTRAVENOUS | Status: AC
Start: 2018-03-05 — End: ?
  Filled 2018-03-05: qty 20

## 2018-03-05 MED ORDER — PROPOFOL 10 MG/ML IV EMUL (WRAP)
INTRAVENOUS | Status: DC | PRN
Start: 2018-03-05 — End: 2018-03-05
  Administered 2018-03-05: 50 mg via INTRAVENOUS
  Administered 2018-03-05: 20 mg via INTRAVENOUS
  Administered 2018-03-05: 50 mg via INTRAVENOUS
  Administered 2018-03-05: 20 mg via INTRAVENOUS
  Administered 2018-03-05: 100 mg via INTRAVENOUS
  Administered 2018-03-05: 50 mg via INTRAVENOUS

## 2018-03-05 MED ORDER — GLYCOPYRROLATE 0.2 MG/ML IJ SOLN
INTRAMUSCULAR | Status: DC | PRN
Start: 2018-03-05 — End: 2018-03-05
  Administered 2018-03-05: 0.2 mg via INTRAVENOUS

## 2018-03-05 MED ORDER — KETAMINE HCL 100 MG/ML IJ SOLN
INTRAMUSCULAR | Status: AC
Start: 2018-03-05 — End: ?
  Filled 2018-03-05: qty 5

## 2018-03-05 MED ORDER — GLYCOPYRROLATE 0.2 MG/ML IJ SOLN
INTRAMUSCULAR | Status: AC
Start: 2018-03-05 — End: ?
  Filled 2018-03-05: qty 1

## 2018-03-05 SURGICAL SUPPLY — 87 items
BALLOON CRE DILTR 12-15MMX8CM (Balloons)
BALLOON FEEDING 24FR X 4.4CM (Endoscopic Supplies) IMPLANT
BALN DILATOR 18-20MM 8CM (Balloon) IMPLANT
BELT RB FM UNV 40X6IN LF NS SLD PNL ELC (Patient Supply) ×2
BELT RIB 6IN M (Patient Supply) ×1
BELT RIB UNIVERSAL L40 IN X W6 IN FOAM DEROYAL SOLID PANEL ELASTIC (Patient Supply) ×2 IMPLANT
BITE BLOCK MAXI 60F LATEX FREE (Procedure Accessories) ×1
BLOCK BITE OD60 FR STURDY STRAP SIDEPORT (Procedure Accessories) ×2
BLOCK BITE OD60 FR STURDY STRAP SIDEPORT DENTAL RETENTION RIM MAXI (Procedure Accessories) ×2 IMPLANT
BRUSH CYTOLOGY 230CM SHTH BRSTL PSTV RING 3MM 2.1MM CONMED COLONOSCOPE (Brushes) IMPLANT
BRUSH CYTOLOGY DISP 3MMX230CM (Brushes)
BRUSH CYTOLOGY L230 CM SHEATH BRISTLE (Brushes)
CATH BLN DIL 5.5FRX240CMX6-8MM (Balloons)
CATH BLN DIL BOSCI 15-18MMX8CM (Balloons)
CATH CRE 6F 10-12 8X180CM (Balloons)
CATHETER BALLOON DILATATION CRE 2.8 MM (Balloons)
CATHETER BALLOON DILATATION CRE PEBAX (Balloons)
CATHETER BALLOON DILATATION L5.5 CM L240 (Balloons)
CATHETER OD10-11-12 MM ODSEC6 FR L180 CM CRE BALLOON DILATATION L8 CM (Balloons) IMPLANT
CATHETER OD10-11-12 MM ODSEC6 FR L180 CM CREâ„¢ BALLOON DILATATION L8 CM (Balloons) IMPLANT
CATHETER OD15-16.5-18 MM ODSEC6 FR L180 CM CRE BALLOON DILATATION L8 (Balloons) IMPLANT
CATHETER OD15-16.5-18 MM ODSEC6 FR L180 CM CREâ„¢ BALLOON DILATATION L8 (Balloons) IMPLANT
CATHETER OD6 FR ODSEC12-13.5-15 MM L180 CM CRE BALLOON DILATATION L8 (Balloons) IMPLANT
CATHETER OD6 FR ODSEC12-13.5-15 MM L180 CM CREâ„¢ BALLOON DILATATION L8 (Balloons) IMPLANT
CATHETER OD7.5 FR ODSEC12-15 MM L240 CM CRE BALLOON DILATATION L5.5 (Balloons) IMPLANT
CATHETER OD7.5 FR ODSEC12-15 MM L240 CM CREâ„¢ BALLOON DILATATION L5.5 (Balloons) IMPLANT
CATHETER OD7.5 FR ODSEC15-18 MM L240 CM CRE BALLOON DILATATION L5.5 (Balloons) IMPLANT
CATHETER OD7.5 FR ODSEC15-18 MM L240 CM CREâ„¢ BALLOON DILATATION L5.5 (Balloons) IMPLANT
CATHETER OD8-9-10 MM ODSEC7.5 FR L240 CM CRE BALLOON DILATATION L5.5 (Balloons) IMPLANT
CATHETER OD8-9-10 MM ODSEC7.5 FR L240 CM CREâ„¢ BALLOON DILATATION L5.5 (Balloons) IMPLANT
COVER CAP SMART (GE Lab Supplies)
DEVICE MEASURE MIC-KEY STOMA (Prep)
DEVICE MEASURING OTW MEASURE MIC-KEY* (Prep) IMPLANT
DILATOR ENDOSCOPIC CRE 2.8 MM 3.2 MM (Balloons)
DILATOR ENDOSCOPIC CRE 2.8 MM 3.2 MM PEBAX ESOPHAGEAL PYLORIC COLONIC (Balloons) IMPLANT
DILATOR ENDOSCOPIC CRE 5.5C 240CM 6-7-8MM 7.5FR PEBAX ESOPHAGEAL (Balloons) IMPLANT
DILATOR ENDOSCOPIC CRE PEBAX ESOPHAGEAL (Balloons)
DILATOR WREGDE  BLLN 10-12MM (Balloons)
DILATOR WREGDE  BLLN 12-15MM (Balloons)
DILATOR WREGDE  BLLN 15-18MM (Balloons)
DILATOR WREGDE  BLLN 8-10MM (Balloons)
FORCEPS BIOPSY L240 CM JUMBO MICROMESH (Instrument)
FORCEPS BIOPSY L240 CM JUMBO MICROMESH TEETH STREAMLINE CATHETER (Instrument) IMPLANT
FORCEPS BIOPSY L240 CM LARGE CAPACITY (Instrument)
FORCEPS BIOPSY L240 CM MICROMESH TEETH STREAMLINE CATHETER NEEDLE (Instrument) ×1 IMPLANT
FORCEPS BIOPSY L240 CM STANDARD CAPACITY (Instrument) ×2
FORCEPS JAW RADIAL JUMBO (Instrument)
FORCEPS RAD JAW 4 BIOSPY W/NDL (Instrument)
FORCEPS RADIAL JAW 4 2.8MM (Instrument) ×1
GAUZE SPONGE 4X4 NS (Dressing) ×1
GOWN CHEMOTHERAPY L47 IN X W28 IN UNIVERSAL OPEN BACK OVERHEAD KNIT (Gown) ×4 IMPLANT
GOWN CHMO PP PE UNV 47X28IN LF OPN BCK (Gown) ×4
GOWN IMPERVIOUS ONE-SIZE4-ALL (Gown) ×2
GUIDEWIRE ENDOSCOPIC L200 CM (Guide Wire)
GUIDEWIRE ENDOSCOPIC SAVARY-GILLIARD L200 CM FLEXIBLE TIP STAINLESS (Guide Wire) IMPLANT
GW SAVARY-GILLIARD SS 200CM (Guide Wire)
JELLY LUB EZ LF STRL H2O SOL NGRS TRNLU (Irrigation Solutions) ×2 IMPLANT
JELLY LUBE STRL FLPTOP 4OZ (Irrigation Solutions) ×1
KIT UNIVERSAL IRRIGATION SOL (Kits) ×3 IMPLANT
NEEDLE CARR-LOCKE INJECT 25GX5 (Needles) IMPLANT
NET SPEC RTRVL UNV RTHNT PLTN 2.5MM 230 (GE Lab Supplies) IMPLANT
NET SPECIMEN RETRIEVAL L230 CM STANDARD (Urology Supply)
NET SPECIMEN RETRIEVAL L230 CM STANDARD SHEATH OD2.5 MM L6 CM X W3 CM (Urology Supply) IMPLANT
OVERTUBE ENDOSCOPIC L25 CM STANDARD (Endoscopic Supplies)
OVERTUBE ENDOSCOPIC L25 CM STANDARD TAPER INSUFFLATION CAP OD19.5 MM (Endoscopic Supplies) IMPLANT
OVERTUBE ESOPHAGEAL 8.6-10.0 (Endoscopic Supplies)
PAD ELECTROSRG GRND REM W CRD (Procedure Accessories) IMPLANT
PROBE COAGULATION L7.2 FT (Endoscopic Supplies)
PROBE COAGULATION L7.2 FT CIRCUMFERENTIAL PLUG PLAY FUNCTIONALITY (Endoscopic Supplies) IMPLANT
PROBE ELECTROSURGICAL L220 CM FLEXIBLE (Procedure Accessories)
PROBE ELECTROSURGICAL L220 CM FLEXIBLE STRAIGHT FIRE OD2.3 MM FIAPC (Procedure Accessories) IMPLANT
PROBE FIAPC CIRC 220MM (Endoscopic Supplies)
PROBE FIAPC STRGHT FIRE 220CM (Procedure Accessories)
RETRIEVER ROTH NET STD 2.5MM (Urology Supply)
SNARE ELECTRO SURG 20MM (Procedure Accessories) IMPLANT
SOL WATER STERILE 1000CC BTLE (Irrigation Solutions) ×1
SPONGE GAUZE L4 IN X W4 IN 16 PLY (Dressing) ×2
SPONGE GAUZE L4 IN X W4 IN 16 PLY MAXIMUM ABSORBENT USP TYPE VII (Dressing) ×2 IMPLANT
SYRINGE 50 ML GRADUATE NONPYROGENIC DEHP (Syringes, Needles)
SYRINGE 50 ML GRADUATE NONPYROGENIC DEHP FREE PVC FREE BD MEDICAL (Syringes, Needles) IMPLANT
SYRINGE INFLATION 60 ML GAUGE CRE (Syringes, Needles) IMPLANT
SYRINGE INFLATION GAUGE 60CC (Syringes, Needles)
SYRINGE SLIP-TIP 60CC (Syringes, Needles)
WATER STERILE PLASTIC POUR BOTTLE 1000 (Irrigation Solutions) ×2
WATER STERILE PLASTIC POUR BOTTLE 1000 ML (Irrigation Solutions) ×2 IMPLANT
WATER STERILE PLASTIC POUR BOTTLE 250 ML (Irrigation Solutions) ×2 IMPLANT
WATER STRL IRRIG 250ML BTL (Irrigation Solutions) ×1

## 2018-03-05 NOTE — Progress Notes (Signed)
History and physical reviewed.  Patient re-examined, H and P unchanged.  J. Devera Englander, MD

## 2018-03-05 NOTE — Discharge Instructions (Signed)
Anesthesia: General Anesthesia     You are watched continuously during your procedure by your anesthesia provider.     You're due to have surgery. During surgery, you'll be given medicine called anesthesia or anesthetic. This will keep you comfortable and pain-free. Youranesthesia providerwill use general anesthesia.  What is general anesthesia?  General anesthesia puts you into a state like deep sleep. It goes into the bloodstream (IV anesthetics), into the lungs (gas anesthetics), or both. You feel nothing during the procedure. You will not remember it. During the procedure, the anesthesia provider monitors you continuously. He or she checks your heart rate and rhythm, blood pressure, breathing, and blood oxygen.   IV anesthetics. IV anesthetics are given through an IV line in your arm. They're often given first. This is so you are asleep before a gas anesthetic is started. Some kinds of IV anesthetics relieve pain. Others relax you. Your doctor will decide which kind is best in your case.   Gas anesthetics. Gas anesthetics are breathed into the lungs. They are often used to keep you asleep. They can be given through a facemask or a tube placed in your larynx or trachea (breathing tube).  ? If you have a facemask, your anesthesia provider will most likely place it over your nose and mouth while you're still awake. You'll breathe oxygen through the mask as your IV anesthetic is started. Gas anesthetic may be added through the mask.  ? If you have atube in the larynx or trachea,it will be inserted into your throat after you're asleep.  Anesthesia tools and medicines  You will likely have:   IV anesthetics. These are put into an IV line into your bloodstream.   Gas anesthetics.You breathe theseanestheticsinto your lungs, where they pass into your bloodstream.   Pulse oximeter. This is a small clip that is attached tothe end of your finger. This measures your blood oxygen level.   Electrocardiography  leads (electrodes).These are small sticky pads that are placedon your chest. They record your heart rate and rhythm.   Blood pressure cuff. This reads your blood pressure.  Risks and possible complications  General anesthesia has some risks. These include:   Breathing problems   Nausea and vomiting   Sore throat or hoarseness (usually temporary)   Allergic reaction to the anesthetic   Irregular heartbeat (rare)   Cardiac arrest (rare)   Anesthesia safety   Follow all instructions you are given for how long not to eat or drink before your procedure.   Be sure your doctor knows what medicines and drugsyou take. This includes over-the-counter medicines, herbs, supplements, alcohol or other drugs. You will be asked when those were last taken.   Have an adult family member or friend drive you home after the procedure.   For the first 24 hours after your surgery:  ? Do not drive or use heavy equipment.  ? Do not make important decisions or sign legal documents. If important decisions or signing legal documents is necessary during the first 24 hours after surgery, have a trusted family member or spouse act on your behalf.  ? Avoid alcohol.  ? Havea responsible adultstay with you.He or shecan watch for problems and help keep you safe.  Date Last Reviewed: 10/25/2015   2000-2018 [employer], [place]. [address] [place], [place] [address]. All rights reserved. This information is not intended as a substitute for professional medical care. Always follow your healthcare professional's instructions.      Discharge  instructions provided in folder.

## 2018-03-05 NOTE — Anesthesia Preprocedure Evaluation (Signed)
Anesthesia Evaluation    AIRWAY    Mallampati: III    TM distance: >3 FB  Neck ROM: full  Mouth Opening:full  Planned to use difficult airway equipment: No CARDIOVASCULAR    cardiovascular exam normal       DENTAL    no notable dental hx     PULMONARY    pulmonary exam normal     OTHER FINDINGS                Scheduled  Procedure(s):  EGD  Attending Physician: Leory Plowman., MD  Admission Date:03/05/2018      Assessment:      Patient Active Problem List   Diagnosis   . DUB (dysfunctional uterine bleeding)   . Fibroid          Past Medical History:   Diagnosis Date   . Anemia    . Asthma without status asthmaticus    . Bronchitis    . Cervical dysplasia     s/p hysterectomy   . Encounter for blood transfusion 2014   . Fracture     h/o rt leg fx   . Gastroesophageal reflux disease    . History of methicillin resistant staphylococcus aureus (MRSA) 2016    abdominal wound   . Pneumonia    . Post-operative nausea and vomiting     apfel 3   . Pulmonary embolism 2009   . Ringing in ear, left 02/2018   . Sinus infection 02/2018    suspected; currently taking abx   . Swelling     rt ankle/foot   . Urinary incontinence    . Urinary tract infection     h/o            Past Surgical History:   Procedure Laterality Date   . COLONOSCOPY     . D & C, HYSTEROSCOPY  10/18/2013    Procedure: D & C, HYSTEROSCOPY;  Surgeon: Denny Levy, MD;  Location: [facility];  Service: Gynecology;  Laterality: N/A;   . FIBULA FRACTURE SURGERY      right   . HYSTERECTOMY, ABDOMINAL, TOTAL  11/01/2013    Procedure: HYSTERECTOMY, ABDOMINAL, TOTAL;  Surgeon: Denny Levy, MD;  Location: [facility];  Service: Gynecology;  Laterality: N/A;  right salpingectomy   . OTHER SURGICAL HISTORY  2016    irrigation and debridement abdominal wound infection (MRSA)   . TOTAL ANKLE REPLACEMENT  2012, 2013    right       Social History     Social History   Substance Use Topics   . Smoking status: Never Smoker   . Smokeless tobacco: Never Used   .  Alcohol use 1.2 oz/week     2 Glasses of wine per week      Allergies:     Allergies   Allergen Reactions   . Dilaudid [Hydromorphone Hcl] Other (See Comments)     "stop breathing"   . Penicillins Anaphylaxis   . Tree Nuts Anaphylaxis   . Latex Swelling     Swelling-contact   . Nuts [Peanut Oil] Rash   . Percocet [Oxycodone-Acetaminophen] [facility] Medications:     Current Facility-Administered Medications   Medication Dose Route Frequency     Home Medications:     Prescriptions Prior to Admission   Medication Sig Dispense Refill Last Dose   . budesonide-formoterol (SYMBICORT) 160-4.5 MCG/ACT inhaler 2 puffs every morning   03/05/2018 at 0800   .  fluticasone (FLONASE) 50 MCG/ACT nasal spray 1 spray by Nasal route daily   03/05/2018 at 0800   . Levalbuterol HCl (XOPENEX IN) Inhale 45 mcg into the lungs as needed       Past Month at Unknown time   . loratadine (CLARITIN) 10 MG tablet Take 10 mg by mouth daily   03/05/2018 at Unknown time   . Montelukast Sodium (SINGULAIR PO) Take 10 mg by mouth daily       03/05/2018 at Unknown time   . predniSONE (DELTASONE) 1 MG tablet Take 1 mg by mouth every morning with breakfast   Past Week at Unknown time   . UNABLE TO FIND as needed Med Name: Energy drinks   Past Week at Unknown time   . UNKNOWN TO PATIENT Antibiotic for suspected sinus infection. Pt denies Azithromycin which is listed in external pharmacy (outside record)   03/04/2018 at Unknown time   . XOPENEX HFA 45 MCG/ACT inhaler Inhale 1 puff into the lungs every 8 (eight) hours as needed       Past Month at Unknown time           Vitals:     Vitals:    03/05/18 1425   BP: 143/71   Pulse: 74   Temp: 36.4 C (97.5 F)   SpO2: 99%       Labs:     Results     ** No results found for the last 24 hours. **               Lab Results   Component Value Date    HGB 10.3 (L) 11/02/2013    PLT 335 11/02/2013       No results found for: NA, CL, CO2, K, CREAT, BUN, GLU    No results found for: AST, ALT, TROPI    No results found  for: PT, PTT, INR    Rads:     Radiology Results (24 Hour)     ** No results found for the last 24 hours. **          Patient's last menstrual period was 09/27/2013.      Body mass index is 42.41 kg/m.    Stop-Bang Score: 2            Relevant Problems   No relevant active problems               Anesthesia Plan    ASA 3     general                     intravenous induction   Detailed anesthesia plan: general IV            informed consent obtained    Plan discussed with CRNA.                   Signed by: Camillo Flaming 03/05/18 2:57 PM

## 2018-03-05 NOTE — Discharge Instr - AVS First Page (Signed)
Reason for your Hospital Admission:  ***      Instructions for after your discharge:  ***

## 2018-03-05 NOTE — Anesthesia Postprocedure Evaluation (Signed)
Anesthesia Post Evaluation    Patient: Kaitlyn Wilcox    Procedure(s):  EGD, BIOPSY    Anesthesia type: general    Last Vitals:   Vitals:    03/05/18 1530   BP: 135/71   Pulse: 90   Resp: 15   Temp:    SpO2: 100%       Anesthesia Post Evaluation:     Patient Evaluated: bedside  Patient Participation: complete - patient participated  Level of Consciousness: awake    Pain Management: adequate    Airway Patency: patent    Anesthetic complications: No      PONV Status: none    Cardiovascular status: hemodynamically stable  Respiratory status: face mask  Hydration status: euvolemic        Signed by: Camillo Flaming, 03/05/2018 3:32 PM

## 2018-03-05 NOTE — Transfer of Care (Signed)
Anesthesia Transfer of Care Note    Patient: Kaitlyn Wilcox    Procedures performed: Procedure(s):  EGD, BIOPSY    Anesthesia type: General TIVA    Patient location:Phase II PACU    Last vitals:   Vitals:    03/05/18 1527   BP:    Pulse:    Temp: 37.1 C (98.7 F)   SpO2:        Post pain: Patient not complaining of pain, continue current therapy      Mental Status:awake    Respiratory Function: tolerating room air    Cardiovascular: stable    Nausea/Vomiting: patient not complaining of nausea or vomiting    Hydration Status: adequate    Post assessment: no apparent anesthetic complications    Signed by: Devona Konig  03/05/18 3:27 PM

## 2018-03-06 ENCOUNTER — Encounter: Payer: Self-pay | Admitting: Gastroenterology

## 2018-03-09 LAB — LAB USE ONLY - HISTORICAL SURGICAL PATHOLOGY

## 2020-07-27 ENCOUNTER — Other Ambulatory Visit: Payer: Self-pay | Admitting: Pulmonary Disease

## 2020-07-27 DIAGNOSIS — R059 Cough, unspecified: Secondary | ICD-10-CM

## 2020-08-04 ENCOUNTER — Ambulatory Visit: Payer: Commercial Managed Care - HMO

## 2020-08-07 ENCOUNTER — Encounter (INDEPENDENT_AMBULATORY_CARE_PROVIDER_SITE_OTHER): Payer: Self-pay | Admitting: Pulmonary Disease

## 2020-08-11 ENCOUNTER — Ambulatory Visit: Payer: Commercial Managed Care - HMO

## 2021-07-06 ENCOUNTER — Emergency Department (HOSPITAL_COMMUNITY): Payer: 59

## 2021-07-06 ENCOUNTER — Emergency Department (HOSPITAL_COMMUNITY)
Admission: EM | Admit: 2021-07-06 | Discharge: 2021-07-06 | Disposition: A | Discharge status: Home / Self Care | Payer: 59 | Attending: Emergency Medicine | Admitting: Emergency Medicine

## 2021-07-06 ENCOUNTER — Ambulatory Visit (HOSPITAL_COMMUNITY)
Admission: EM | Admit: 2021-07-06 | Discharge: 2021-07-06 | Disposition: A | Discharge status: Home / Self Care | Payer: 59 | Attending: Family Medicine | Admitting: Family Medicine

## 2021-07-06 ENCOUNTER — Encounter (HOSPITAL_COMMUNITY): Payer: Self-pay | Admitting: Emergency Medicine

## 2021-07-06 ENCOUNTER — Other Ambulatory Visit: Payer: Self-pay

## 2021-07-06 DIAGNOSIS — Z859 Personal history of malignant neoplasm, unspecified: Secondary | ICD-10-CM | POA: Insufficient documentation

## 2021-07-06 DIAGNOSIS — J45909 Unspecified asthma, uncomplicated: Secondary | ICD-10-CM | POA: Diagnosis not present

## 2021-07-06 DIAGNOSIS — Z20822 Contact with and (suspected) exposure to covid-19: Secondary | ICD-10-CM | POA: Diagnosis not present

## 2021-07-06 DIAGNOSIS — O0289 Other abnormal products of conception: Secondary | ICD-10-CM | POA: Insufficient documentation

## 2021-07-06 DIAGNOSIS — M25562 Pain in left knee: Secondary | ICD-10-CM

## 2021-07-06 DIAGNOSIS — Z91040 Latex allergy status: Secondary | ICD-10-CM | POA: Insufficient documentation

## 2021-07-06 DIAGNOSIS — S8992XA Unspecified injury of left lower leg, initial encounter: Secondary | ICD-10-CM

## 2021-07-06 DIAGNOSIS — R091 Pleurisy: Secondary | ICD-10-CM | POA: Diagnosis not present

## 2021-07-06 DIAGNOSIS — R079 Chest pain, unspecified: Secondary | ICD-10-CM | POA: Insufficient documentation

## 2021-07-06 DIAGNOSIS — X501XXA Overexertion from prolonged static or awkward postures, initial encounter: Secondary | ICD-10-CM | POA: Insufficient documentation

## 2021-07-06 DIAGNOSIS — R0602 Shortness of breath: Secondary | ICD-10-CM | POA: Diagnosis not present

## 2021-07-06 DIAGNOSIS — R059 Cough, unspecified: Secondary | ICD-10-CM | POA: Insufficient documentation

## 2021-07-06 DIAGNOSIS — Z91010 Allergy to peanuts: Secondary | ICD-10-CM | POA: Diagnosis not present

## 2021-07-06 DIAGNOSIS — R0682 Tachypnea, not elsewhere classified: Secondary | ICD-10-CM | POA: Diagnosis not present

## 2021-07-06 HISTORY — DX: Unspecified asthma, uncomplicated: J45.909

## 2021-07-06 HISTORY — DX: Migraine, unspecified, not intractable, without status migrainosus: G43.909

## 2021-07-06 HISTORY — DX: Other pulmonary embolism without acute cor pulmonale: I26.99

## 2021-07-06 HISTORY — DX: Malignant (primary) neoplasm, unspecified: C80.1

## 2021-07-06 LAB — CBC
HCT: 44.3 % (ref 36.0–46.0)
Hemoglobin: 13.8 g/dL (ref 12.0–15.0)
MCH: 22.7 pg — ABNORMAL LOW (ref 26.0–34.0)
MCHC: 31.2 g/dL (ref 30.0–36.0)
MCV: 72.9 fL — ABNORMAL LOW (ref 80.0–100.0)
Platelets: 393 10*3/uL (ref 150–400)
RBC: 6.08 MIL/uL — ABNORMAL HIGH (ref 3.87–5.11)
RDW: 16.1 % — ABNORMAL HIGH (ref 11.5–15.5)
WBC: 6.3 10*3/uL (ref 4.0–10.5)
nRBC: 0 % (ref 0.0–0.2)

## 2021-07-06 LAB — BASIC METABOLIC PANEL
Anion gap: 7 (ref 5–15)
BUN: 11 mg/dL (ref 6–20)
CO2: 21 mmol/L — ABNORMAL LOW (ref 22–32)
Calcium: 9.7 mg/dL (ref 8.9–10.3)
Chloride: 110 mmol/L (ref 98–111)
Creatinine, Ser: 0.82 mg/dL (ref 0.44–1.00)
GFR, Estimated: >60 mL/min (ref >60)
Glucose, Bld: 102 mg/dL — ABNORMAL HIGH (ref 70–99)
Potassium: 3.9 mmol/L (ref 3.5–5.1)
Sodium: 138 mmol/L (ref 135–145)

## 2021-07-06 LAB — TROPONIN I (HIGH SENSITIVITY)
Troponin I (High Sensitivity): 5 ng/L (ref <18)
Troponin I (High Sensitivity): 3 ng/L (ref <18)

## 2021-07-06 LAB — RESP PANEL BY RT-PCR (FLU A&B, COVID) ARPGX2
Influenza A by PCR: NEGATIVE
Influenza B by PCR: NEGATIVE
SARS Coronavirus 2 by RT PCR: NEGATIVE

## 2021-07-06 LAB — I-STAT BETA HCG BLOOD, ED (MC, WL, AP ONLY): I-stat hCG, quantitative: <5 m[IU]/mL (ref <5)

## 2021-07-06 MED ORDER — IOHEXOL 350 MG/ML SOLN
75.0000 mL | Freq: Once | INTRAVENOUS | Status: AC | PRN
  Administered 2021-07-06: 75 mL via INTRAVENOUS

## 2021-07-06 MED ORDER — KETOROLAC TROMETHAMINE 30 MG/ML IJ SOLN
15.0000 mg | Freq: Once | INTRAMUSCULAR | Status: AC
  Administered 2021-07-06: 15 mg via INTRAVENOUS
  Filled 2021-07-06: qty 1

## 2021-07-06 MED ORDER — ETODOLAC 300 MG PO CAPS: 300.0000 mg | ORAL_CAPSULE | Freq: Three times a day (TID) | ORAL | 0 refills | Status: AC

## 2021-07-06 MED ORDER — BENZONATATE 100 MG PO CAPS: 100.0000 mg | ORAL_CAPSULE | Freq: Three times a day (TID) | ORAL | 0 refills | Status: AC

## 2021-07-06 MED ORDER — KETOROLAC TROMETHAMINE 15 MG/ML IJ SOLN
15.0000 mg | Freq: Once | INTRAMUSCULAR | Status: AC
  Administered 2021-07-06: 15 mg via INTRAVENOUS
  Filled 2021-07-06: qty 1

## 2021-07-06 NOTE — Discharge Instructions (Addendum)
Take the medications as needed for pain.  You can also try over the counter topical lidocaine patches for pain. Follow up with your doctor and an orthopedic doctor when you return home

## 2021-07-06 NOTE — ED Notes (Signed)
Patient is being discharged from the Urgent Care and sent to the Emergency Department via by wheelchair . Per rachel, pa, patient is in need of higher level of care due to sob/chest pain. Patient is aware and verbalizes understanding of plan of care.  Vitals:   07/06/21 1119  Pulse: 90  Resp: (!) 24  Temp: 98.2 F (36.8 C)  SpO2: 100%

## 2021-07-06 NOTE — ED Provider Notes (Signed)
[facility] Provider Note   CSN: [id number] Arrival date & time: 07/06/21  1157     History Chief Complaint  Patient presents with   Chest Pain    Brandy Ellis is a 50 y.o. female.   Chest Pain Associated symptoms: cough and shortness of breath   Associated symptoms: no abdominal pain, no back pain, no dizziness, no fatigue, no fever, no headache, no nausea, no numbness, no palpitations, no vomiting and no weakness   Patient is a 50 year old female who presents for left-sided chest pain pain.  She reports that the pain is worse with deep inspiration.  She has been taking shallow breaths to avoid worsening the pain.  She has also had exertional shortness of breath.  The symptoms appear to come on last night.  Other recent history includes a twisting of her knee and when she felt a pop.  This is her left knee, and knee which previously had surgery done.  She has since been wearing a knee brace and limiting her mobility so that she only places minimal weight on her left knee, which has worsened pain with weightbearing.  She is in town helping her son move.  She normally resides in [place].  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  She has not been on any blood thinning medications in many years.  She also has a history of asthma but states that her current shortness of breath does not feel like her previous episodes of asthma exacerbation.  She denies any history of ACS.    Past Medical History:  Diagnosis Date   Asthma    Cancer ([place])    Migraine    Pulmonary embolism ([place])     There are no problems to display for this patient.   History reviewed. No pertinent surgical history.   OB History   No obstetric history on file.     No family history on file.  Social History   Tobacco Use   Smoking status: Never   Smokeless tobacco: Never  Vaping Use   Vaping Use: Never used  Substance Use Topics   Alcohol use: Yes    Home  Medications Prior to Admission medications   Medication Sig Start Date End Date Taking? Authorizing Provider  acetaminophen (TYLENOL) 325 MG tablet Take 325 mg by mouth in the morning and at bedtime.   Yes [provider]  acetaZOLAMIDE (DIAMOX) 250 MG tablet Take 250 mg by mouth at bedtime. 02/17/21  Yes [provider]  cetirizine (ZYRTEC) 10 MG tablet Take 10 mg by mouth daily.   Yes [provider]  fluticasone (FLONASE) 50 MCG/ACT nasal spray Place 1 spray into the nose daily. 12/16/09  Yes [provider]  gabapentin (NEURONTIN) 300 MG capsule Take 300 mg by mouth at bedtime. 05/14/21  Yes [provider]  levalbuterol (XOPENEX HFA) 45 MCG/ACT inhaler Inhale 1-2 puffs into the lungs every 6 (six) hours as needed for shortness of breath or wheezing. 10/19/13  Yes [provider]  levalbuterol Penne Lash) 1.25 MG/3ML nebulizer solution Take 1.25 mg by nebulization every 6 (six) hours as needed for wheezing or shortness of breath. 07/21/20  Yes [provider]  montelukast (SINGULAIR) 10 MG tablet Take 10 mg by mouth at bedtime. 05/19/21  Yes [provider]  omeprazole (PRILOSEC) 20 MG capsule Take 20 mg by mouth at bedtime. 07/24/09  Yes [provider]    Allergies    Dilantin [  phenytoin], Oxycodone-acetaminophen, Penicillins, Phenytoin sodium extended, Tree extract, Latex, Peanut oil, and Codeine  Review of Systems   Review of Systems  Constitutional:  Positive for activity change. Negative for chills, fatigue and fever.  HENT:  Negative for ear pain and sore throat.   Eyes:  Negative for pain and visual disturbance.  Respiratory:  Positive for cough, chest tightness and shortness of breath. Negative for wheezing.   Cardiovascular:  Positive for chest pain. Negative for palpitations and leg swelling.  Gastrointestinal:  Negative for abdominal pain, nausea and vomiting.  Genitourinary:  Negative for dysuria and  hematuria.  Musculoskeletal:  Positive for arthralgias and gait problem. Negative for back pain, neck pain and neck stiffness.  Skin:  Negative for color change and rash.  Neurological:  Negative for dizziness, seizures, syncope, weakness, light-headedness, numbness and headaches.  Hematological:  Does not bruise/bleed easily.  Psychiatric/Behavioral:  Negative for confusion and decreased concentration.   All other systems reviewed and are negative.  Physical Exam Updated Vital Signs BP 134/82 (BP Location: Right Arm)   Pulse 61   Temp 98.2 F (36.8 C) (Oral)   Resp 16   SpO2 100%   Physical Exam Vitals and nursing note reviewed.  Constitutional:      General: She is not in acute distress.    Appearance: She is well-developed. She is not ill-appearing, toxic-appearing or diaphoretic.  HENT:     Head: Normocephalic and atraumatic.  Eyes:     Conjunctiva/sclera: Conjunctivae normal.  Neck:     Vascular: No JVD.  Cardiovascular:     Rate and Rhythm: Normal rate and regular rhythm.     Heart sounds: No murmur heard. Pulmonary:     Effort: Pulmonary effort is normal. No respiratory distress.     Breath sounds: Normal breath sounds. No decreased breath sounds, wheezing or rales.  Chest:     Chest wall: No tenderness.  Abdominal:     Palpations: Abdomen is soft.     Tenderness: There is no abdominal tenderness.  Musculoskeletal:     Cervical back: Neck supple.     Right lower leg: No tenderness.     Left lower leg: No tenderness.     Comments: Tenderness to anterior lateral aspect of left knee.  No laxity appreciated.  Range of motion intact.  Pain not worsened with active or passive range of motion.  Skin:    General: Skin is warm and dry.  Neurological:     Mental Status: She is alert and oriented to person, place, and time.     Cranial Nerves: No cranial nerve deficit.     Motor: No weakness.  Psychiatric:        Mood and Affect: Mood normal.        Behavior: Behavior  normal.    ED Results / Procedures / Treatments   Labs (all labs ordered are listed, but only abnormal results are displayed) Labs Reviewed  BASIC METABOLIC PANEL - Abnormal; Notable for the following components:      Result Value   CO2 21 (*)    Glucose, Bld 102 (*)    All other components within normal limits  CBC - Abnormal; Notable for the following components:   RBC 6.08 (*)    MCV 72.9 (*)    MCH 22.7 (*)    RDW 16.1 (*)    All other components within normal limits  RESP PANEL BY RT-PCR (FLU A&B, COVID) ARPGX2  I-STAT BETA HCG BLOOD, ED ([facility],  [facility], [facility] ONLY)  TROPONIN I (HIGH SENSITIVITY)  TROPONIN I (HIGH SENSITIVITY)    EKG None ED ECG REPORT   Date: 07/06/2021  Rate: 79  Rhythm: normal sinus rhythm  QRS Axis: normal  Intervals: normal  ST/T Wave abnormalities: normal  Conduction Disutrbances:none  Narrative Interpretation:   Old EKG Reviewed: none available  I have personally reviewed the EKG tracing and agree with the computerized printout as noted.  Radiology DG Chest 2 View  Result Date: 07/06/2021 CLINICAL DATA:  Chest pain, shortness of breath EXAM: CHEST - 2 VIEW COMPARISON:  None. FINDINGS: The heart size and mediastinal contours are within normal limits. Mild opacity of the left lung base anteriorly may reflect pericardial fat and overlying soft tissue versus atelectasis/airspace disease. There is no pleural effusion or pneumothorax. The right lung is clear. The visualized skeletal structures are unremarkable. IMPRESSION: Mild opacity of the left lung base may reflect pericardial fat and overlying soft tissue versus atelectasis/airspace disease. Electronically Signed   By: Zerita Boers M.D.   On: 07/06/2021 13:27   DG Knee Complete 4 Views Left  Result Date: 07/06/2021 CLINICAL DATA:  Heard knee pop during a massage. History of reconstructive surgery but a month ago. EXAM: LEFT KNEE - COMPLETE 4+ VIEW COMPARISON:  None. FINDINGS: No evidence of fracture,  dislocation, or joint effusion. Mild tricompartmental osteoarthritis is noted. Bone cement/bone graft is noted in the tibial plateau and distal femur from prior surgery, likely for a prior tibial plateau fracture. Soft tissues are unremarkable. IMPRESSION: Mild degenerative changes.  No acute osseous injury. Electronically Signed   By: Zerita Boers M.D.   On: 07/06/2021 13:39    Procedures Procedures   Medications Ordered in ED Medications  ketorolac (TORADOL) 15 MG/ML injection 15 mg (has no administration in time range)    ED Course  I have reviewed the triage vital signs and the nursing notes.  Pertinent labs & imaging results that were available during my care of the patient were reviewed by me and considered in my medical decision making (see chart for details).    MDM Rules/Calculators/A&P                           Patient is a 50 year old female with history of unprovoked PE, not currently on blood thinners, presenting for left-sided chest pain, pleurisy, and shortness of breath.  Prior to being bedded in the ED, ACS work-up was initiated.  Upon initial assessment, patient is well-appearing.  Breathing appears even and unlabored.  She does have a history of asthma, however, no wheezes are appreciated on lung auscultation.  There is no area of tenderness where she is experiencing the chest pain.  Given her history of unprovoked PE, CTA of chest to be obtained.  Patient's knee x-ray was negative for osseous or joint abnormality.  Patient does have an orthopedic surgeon back home in [place] who she will likely need to follow-up with for further evaluation.  Care of patient was signed out to oncoming ED provider.  Final Clinical Impression(s) / ED Diagnoses Final diagnoses:  None    Rx / DC Orders ED Discharge Orders     None        Godfrey Pick, MD 07/06/21 1737

## 2021-07-06 NOTE — ED Triage Notes (Signed)
Pt reports getting a massage yesterday and heard L knee pop.  Unable to bend knee today.  [place] from [place] to help son move into dorm.  During the night she started having L sided chest pain and SOB.  Hx of PE.

## 2021-07-06 NOTE — ED Triage Notes (Signed)
Patient came to urgent care for a left knee complaint.    While waiting started coughing.  History of asthma.  Patient reports last night had heaviness in middle of chest.  Non-radiating pain.  Reports having pain all night.

## 2021-07-06 NOTE — ED Provider Notes (Signed)
Reviewed CT scan findings with patient.  No signs of acute abnormality.  Will provide crutches for her knee pain.  Will prescribe Lodine to help with the pain discomfort.  Patient plans on returning back to home.  She will follow-up with orthopedic doctor and her primary doctor   Dorie Rank, MD 07/06/21 1954

## 2021-07-06 NOTE — ED Provider Notes (Signed)
[facility]    CSN: [id number] Arrival date & time: 07/06/21  1023      History   Chief Complaint Chief Complaint  Patient presents with   Chest Pain   Shortness of Breath    HPI Brandy Ellis is a 50 y.o. female.   Patient presenting today with concern of left knee pain but notes in triage that she began having left sided chest tightness, pressure, discomfort and significant SOB not relieved with her albuterol inhaler overnight that is worsening into this morning. She has had asthma attacks in the past but they've not felt similar to this and are usually relieved with albuterol inhaler. Denies wheezing, fever, chills, injury to the chest, headache, dizziness, palpitations, hx of cardiac issues. Not trying anything OTC for sxs thus far. Has taken 3-4 puffs of albuterol since arrival this morning to clinic with absolutely no relief.    Past Medical History:  Diagnosis Date   Asthma    Cancer ([place])    Migraine    Pulmonary embolism ([place])     There are no problems to display for this patient.   History reviewed. No pertinent surgical history.  OB History   No obstetric history on file.      Home Medications    Prior to Admission medications   Not on File    Family History History reviewed. No pertinent family history.  Social History Social History   Tobacco Use   Smoking status: Never   Smokeless tobacco: Never  Vaping Use   Vaping Use: Never used  Substance Use Topics   Alcohol use: Yes     Allergies   Oxycodone-acetaminophen, Penicillins, Tree extract, Latex, Peanut oil, Codeine, Dilantin [phenytoin], and Phenytoin sodium extended   Review of Systems Review of Systems PER HPI   Physical Exam Triage Vital Signs ED Triage Vitals  Enc Vitals Group     BP --      Pulse Rate 07/06/21 1119 90     Resp 07/06/21 1119 (!) 24     Temp 07/06/21 1119 98.2 F (36.8 C)     Temp Source 07/06/21 1119 Oral     SpO2 07/06/21 1119 100 %      Weight --      Height --      Head Circumference --      Peak Flow --      Pain Score 07/06/21 1120 5     Pain Loc --      Pain Edu? --      Excl. in [place]? --    No data found.  Updated Vital Signs Pulse 90   Temp 98.2 F (36.8 C) (Oral)   Resp (!) 24   SpO2 100%   Visual Acuity Right Eye Distance:   Left Eye Distance:   Bilateral Distance:    Right Eye Near:   Left Eye Near:    Bilateral Near:     Physical Exam Vitals and nursing note reviewed.  Constitutional:      Appearance: Normal appearance. She is not ill-appearing.  HENT:     Head: Atraumatic.     Nose: Nose normal.     Mouth/Throat:     Mouth: Mucous membranes are moist.     Pharynx: Oropharynx is clear.  Eyes:     Extraocular Movements: Extraocular movements intact.     Conjunctiva/sclera: Conjunctivae normal.  Cardiovascular:     Rate and Rhythm: Normal rate and regular rhythm.  Heart sounds: Normal heart sounds.  Pulmonary:     Breath sounds: Normal breath sounds.     Comments: Tachypneic at rest, no obvious wheezing, rales and breath sounds equal b/l Musculoskeletal:     Cervical back: Normal range of motion and neck supple.     Comments: In wheelchair due to left knee pain, left knee in brace. Ambulatory at baseline  Skin:    General: Skin is warm and dry.  Neurological:     Mental Status: She is alert and oriented to person, place, and time.     Motor: No weakness.  Psychiatric:     Comments: Significantly anxious     UC Treatments / Results  Labs (all labs ordered are listed, but only abnormal results are displayed) Labs Reviewed - No data to display  EKG   Radiology No results found.  Procedures Procedures (including critical care time)  Medications Ordered in UC Medications - No data to display  Initial Impression / Assessment and Plan / UC Course  I have reviewed the triage vital signs and the nursing notes.  Pertinent labs & imaging results that were available  during my care of the patient were reviewed by me and considered in my medical decision making (see chart for details).     HR, O2 saturation both benign and WNL but patient obvious tachypneic at rest and states she is feeling significantly SOB which has been worsening since onset of CP overnight. Has never had an episode like this per patient. No past EKGs as she is here from out of town, no known cardiac hx. EKG today showing NSR at 78 bpm without ST elevations but discussed with patient that given her sudden onset and worsening CP, SOB not resolved with albuterol she would benefit from further cardiac testing in the ED. She is agreeable and will go via private vehicle for further evaluation.   Final Clinical Impressions(s) / UC Diagnoses   Final diagnoses:  Chest pain, unspecified type  Shortness of breath  Tachypnea  Acute pain of left knee   Discharge Instructions   None    ED Prescriptions   None    PDMP not reviewed this encounter.   Volney American, [place] 07/06/21 1226

## 2021-11-03 ENCOUNTER — Other Ambulatory Visit: Payer: Self-pay

## 2021-11-03 ENCOUNTER — Emergency Department (HOSPITAL_COMMUNITY)
Admission: EM | Admit: 2021-11-03 | Discharge: 2021-11-03 | Disposition: A | Discharge status: Home / Self Care | Payer: 59 | Attending: Emergency Medicine | Admitting: Emergency Medicine

## 2021-11-03 ENCOUNTER — Encounter (HOSPITAL_COMMUNITY): Payer: Self-pay

## 2021-11-03 ENCOUNTER — Emergency Department (HOSPITAL_COMMUNITY): Payer: 59

## 2021-11-03 DIAGNOSIS — Z7951 Long term (current) use of inhaled steroids: Secondary | ICD-10-CM | POA: Diagnosis not present

## 2021-11-03 DIAGNOSIS — Z91010 Allergy to peanuts: Secondary | ICD-10-CM | POA: Diagnosis not present

## 2021-11-03 DIAGNOSIS — Z91040 Latex allergy status: Secondary | ICD-10-CM | POA: Diagnosis not present

## 2021-11-03 DIAGNOSIS — J45909 Unspecified asthma, uncomplicated: Secondary | ICD-10-CM | POA: Diagnosis not present

## 2021-11-03 DIAGNOSIS — M25562 Pain in left knee: Secondary | ICD-10-CM | POA: Diagnosis not present

## 2021-11-03 MED ORDER — DEXAMETHASONE SODIUM PHOSPHATE 10 MG/ML IJ SOLN
10.0000 mg | Freq: Once | INTRAMUSCULAR | Status: AC
  Administered 2021-11-03: 10 mg via INTRAMUSCULAR
  Filled 2021-11-03: qty 1

## 2021-11-03 NOTE — ED Notes (Signed)
Registration attempted to have pt sign HIPPA privacy notice and consent to treat. Pt says she has already signed that with the nurse in triage. This RN spoke with pt about what the difference was between the medical screening exam consent that the nurses have you sign and the forms that registration has you sign. Pt continues to refuse to sign the HIPPA privacy notice and consent to treat. Registration made aware of this RNs conversation with pt.

## 2021-11-03 NOTE — ED Triage Notes (Signed)
Pt reports progressively worsening left knee pain for the past few days. She states yesterday she fell due to the pain in her knee and now endorses worsening pain and trouble walking. She reports hx of surgery to left knee in June.

## 2021-11-03 NOTE — Discharge Instructions (Signed)
Please use Tylenol or ibuprofen for pain.  You may use 600 mg ibuprofen every 6 hours or 1000 mg of Tylenol every 6 hours.  You may choose to alternate between the 2.  This would be most effective.  Not to exceed 4 g of Tylenol within 24 hours.  Not to exceed 3200 mg ibuprofen 24 hours.  You can use the robaxin in addition to the above. It may make you somewhat sleepy. Take care and see how you feel before piloting a motor vehicle.  I recommend ice, rest, elevation of the affected limb. Please follow up with orthopedics at your earliest convenience for further evaluation.

## 2021-11-03 NOTE — ED Provider Notes (Signed)
[facility] DEPT Provider Note   CSN: [id number] Arrival date & time: 11/03/21  2992     History Chief Complaint  Patient presents with   Knee Pain    Brandy Ellis is a 50 y.o. female with a past medical history significant for prior surgery of the left knee back in June who presents with acute on chronic left knee pain.  Patient reports that she has had worsening swelling and pain of the left knee over the last few months, worse over the last few days.  Patient reports that she has occasional instances where she is walking in the left knee will suddenly give out on her.  Patient reports her initial injury involved a traumatic fall on the knee during a run earlier this year.  Patient rates pain as 4/10 at rest, 6 or 7 out of 10 with walking.  Patient reports she has tried naproxen, Aleve, Tylenol with minimal relief of symptoms.  Patient reports she has not returned to her orthopedist at this time.  Patient denies any distal numbness, tingling.  Patient is not taking a blood thinner at this time.   Knee Pain     Past Medical History:  Diagnosis Date   Asthma    Cancer ([place])    Migraine    Pulmonary embolism ([place])     There are no problems to display for this patient.   History reviewed. No pertinent surgical history.   OB History   No obstetric history on file.     History reviewed. No pertinent family history.  Social History   Tobacco Use   Smoking status: Never   Smokeless tobacco: Never  Vaping Use   Vaping Use: Never used  Substance Use Topics   Alcohol use: Yes    Home Medications Prior to Admission medications   Medication Sig Start Date End Date Taking? Authorizing Provider  acetaminophen (TYLENOL) 325 MG tablet Take 325 mg by mouth in the morning and at bedtime.    [provider]  acetaZOLAMIDE (DIAMOX) 250 MG tablet Take 250 mg by mouth at bedtime. 02/17/21   [provider]  benzonatate (TESSALON)  100 MG capsule Take 1 capsule (100 mg total) by mouth every 8 (eight) hours. 07/06/21   Dorie Rank, MD  cetirizine (ZYRTEC) 10 MG tablet Take 10 mg by mouth daily.    [provider]  fluticasone (FLONASE) 50 MCG/ACT nasal spray Place 1 spray into the nose daily. 12/16/09   [provider]  gabapentin (NEURONTIN) 300 MG capsule Take 300 mg by mouth at bedtime. 05/14/21   [provider]  levalbuterol Penne Lash HFA) 45 MCG/ACT inhaler Inhale 1-2 puffs into the lungs every 6 (six) hours as needed for shortness of breath or wheezing. 10/19/13   [provider]  levalbuterol Penne Lash) 1.25 MG/3ML nebulizer solution Take 1.25 mg by nebulization every 6 (six) hours as needed for wheezing or shortness of breath. 07/21/20   [provider]  montelukast (SINGULAIR) 10 MG tablet Take 10 mg by mouth at bedtime. 05/19/21   [provider]  omeprazole (PRILOSEC) 20 MG capsule Take 20 mg by mouth at bedtime. 07/24/09   [provider]    Allergies    Dilantin [phenytoin], Oxycodone-acetaminophen, Penicillins, Phenytoin sodium extended, Tree extract, Latex, Peanut oil, and Codeine  Review of Systems   Review of Systems  Musculoskeletal:  Positive for gait problem and joint swelling.  All other systems reviewed and are negative.  Physical Exam Updated  Vital Signs BP (!) 150/92 (BP Location: Left Arm)   Pulse 75   Temp 98 F (36.7 C) (Oral)   Resp 18   Ht 5\' 5"  (1.651 m)   Wt 106.1 kg   SpO2 99%   BMI 38.92 kg/m   Physical Exam Vitals and nursing note reviewed.  Constitutional:      General: She is not in acute distress.    Appearance: Normal appearance.  HENT:     Head: Normocephalic and atraumatic.  Eyes:     General:        Right eye: No discharge.        Left eye: No discharge.  Cardiovascular:     Rate and Rhythm: Normal rate and regular rhythm.     Pulses: Normal pulses.     Comments: Intact DP, PT pulses of the left  leg Pulmonary:     Effort: Pulmonary effort is normal. No respiratory distress.  Musculoskeletal:        General: No deformity.     Comments: There is some swelling globally of the left knee without evidence of joint effusion, negative balloon, ballottement test.  There is no anterior, posterior drawer laxity.  There is no varus, valgus laxity.  There is significant tenderness to palpation lateral aspect of the patella as well as superior aspect of the patella.  There is some crepitus with motion.  Patient has intact strength 5 out of 5 to flexion extension of the affected joint.  Patient is able to stand, ambulate without difficulty, with some pain prior to discharge.  Skin:    General: Skin is warm and dry.     Capillary Refill: Capillary refill takes less than 2 seconds.  Neurological:     Mental Status: She is alert and oriented to person, place, and time.  Psychiatric:        Mood and Affect: Mood normal.        Behavior: Behavior normal.    ED Results / Procedures / Treatments   Labs (all labs ordered are listed, but only abnormal results are displayed) Labs Reviewed - No data to display  EKG None  Radiology DG Knee Complete 4 Views Left  Result Date: 11/03/2021 CLINICAL DATA:  Acute left knee pain after fall. EXAM: LEFT KNEE - COMPLETE 4+ VIEW COMPARISON:  July 06, 2021. FINDINGS: No evidence of acute fracture, dislocation, or joint effusion. Mild osteophyte formation is seen involving medial and lateral joint spaces. Stable sclerotic densities consistent with prior bone augmentation of the distal femur and proximal tibia. Soft tissues are unremarkable. 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 Postsurgical changes as noted above. No acute abnormality is noted. Electronically Signed   By: Marijo Conception M.D.   On: 11/03/2021 12:28    Procedures Procedures   Medications Ordered in ED Medications  dexamethasone (DECADRON) injection 10 mg  (10 mg Intramuscular Given 11/03/21 1305)    ED Course  I have reviewed the triage vital signs and the nursing notes.  Pertinent labs & imaging results that were available during my care of the patient were reviewed by me and considered in my medical decision making (see chart for details).    MDM Rules/Calculators/A&P                         Overall well-appearing patient with history of traumatic injury to the left knee, former surgery of the left knee who presents with acute  on chronic worsening left knee pain, swelling, feeling of left leg giving out secondary to pain.  On examination there is no evidence of collateral ligament laxity, strain, tear.  Radiographic imaging of the left knee shows no evidence of effusion, however there are degenerative changes noted that agree with patient's distribution of tenderness.  Overall neurovascularly intact left lower limb.  Intact DP, PT pulses of the left.    Discussed with patient that traumatic injury, surgical manipulation of the knee, as well as normal wear and tear and strain have led to arthritic changes.  Discussed this is an inflammatory process, in addition to continuing her home regimen of ibuprofen, Tylenol I do recommend injection of Decadron today.  Encourage patient to follow-up with orthopedics as she may need further evaluation, surgery, or more targeted intra joint injection of corticosteroid.  Patient understands and agrees to plan, discharged in stable condition. Final Clinical Impression(s) / ED Diagnoses Final diagnoses:  Acute pain of left knee    Rx / DC Orders ED Discharge Orders     None        Dorien Chihuahua 11/03/21 1411    Valarie Merino, MD 11/03/21 [phone]

## 2022-11-29 ENCOUNTER — Other Ambulatory Visit: Payer: Self-pay | Admitting: Orthopaedic Surgery

## 2022-11-29 DIAGNOSIS — G8918 Other acute postprocedural pain: Secondary | ICD-10-CM

## 2022-11-29 DIAGNOSIS — M5416 Radiculopathy, lumbar region: Secondary | ICD-10-CM

## 2023-04-19 IMAGING — CR DG KNEE COMPLETE 4+V*L*
4 series · 4 of 4 positions shown · non-contrast
Comparison: July 06, 2021.

CLINICAL DATA: Acute left knee pain after fall.

EXAM:
LEFT KNEE - COMPLETE 4+ VIEW

[t knee obl left (1 of 2)]
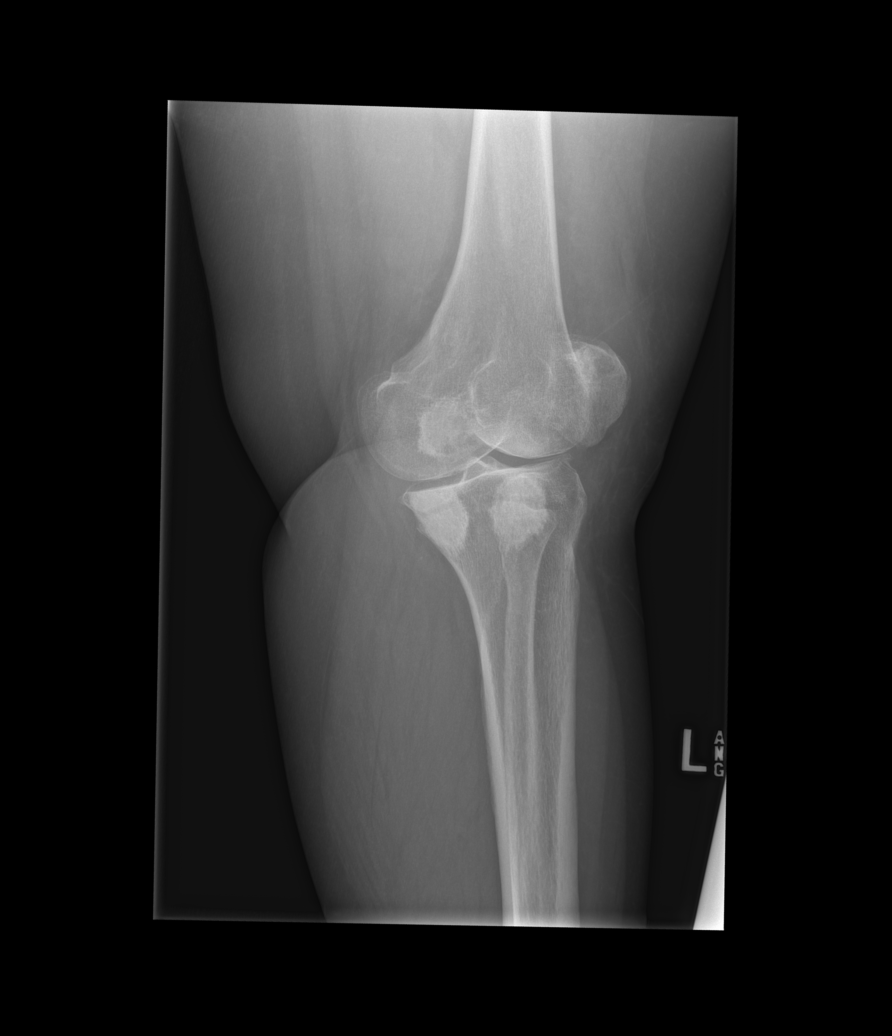

[t knee ap left]
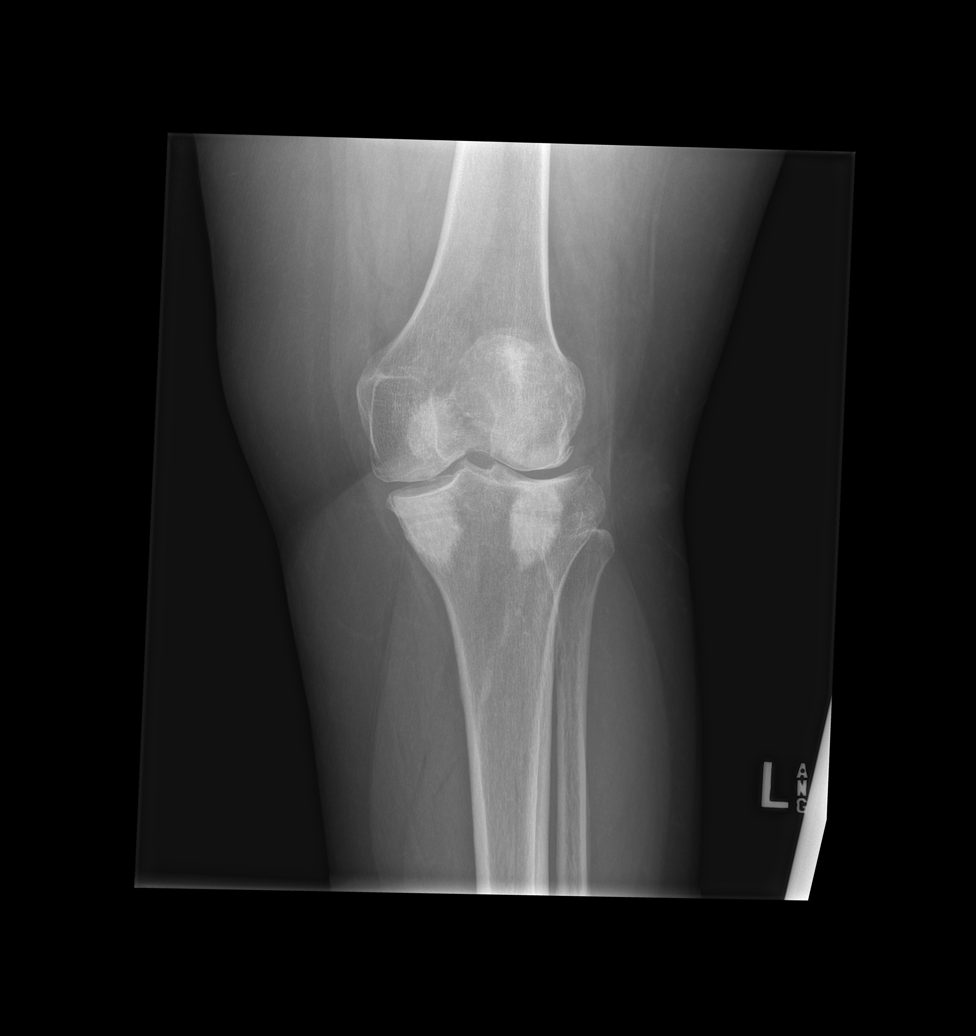

[t knee obl left (2 of 2)]
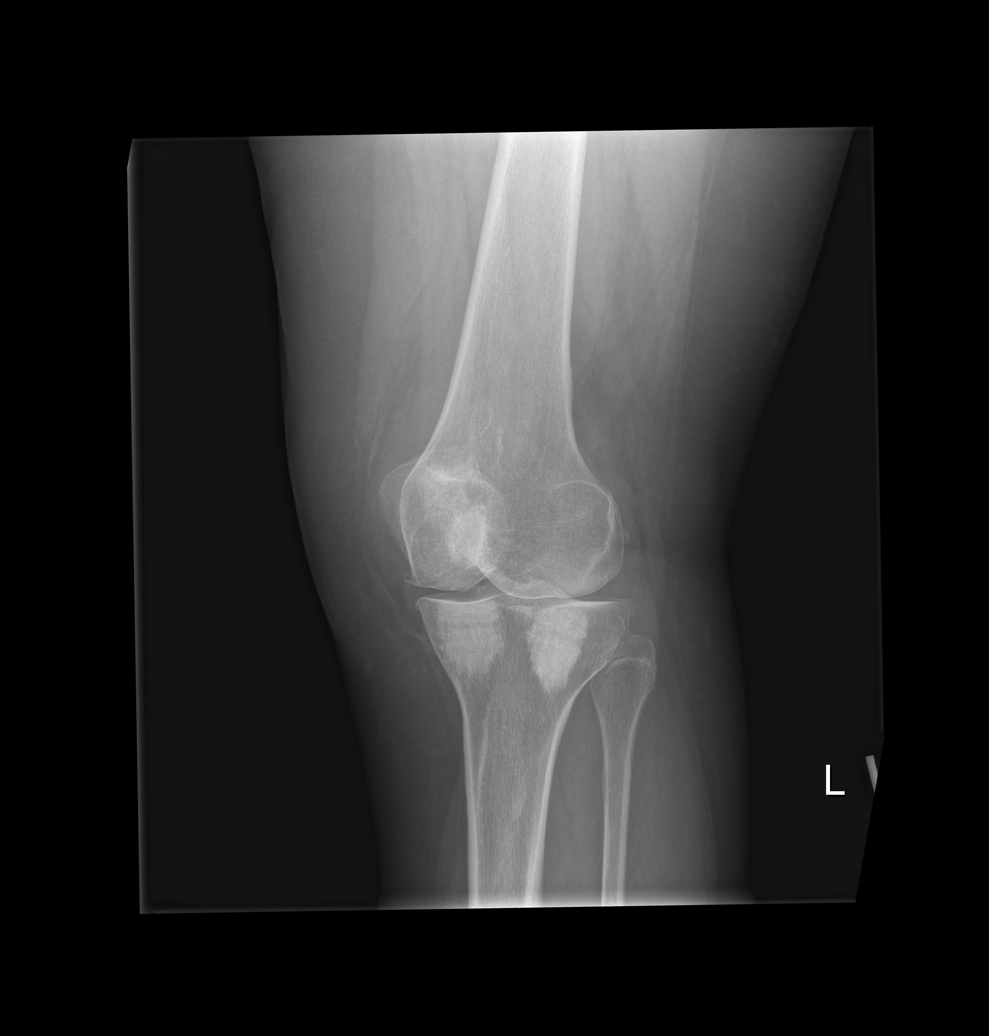

[t knee lat left]
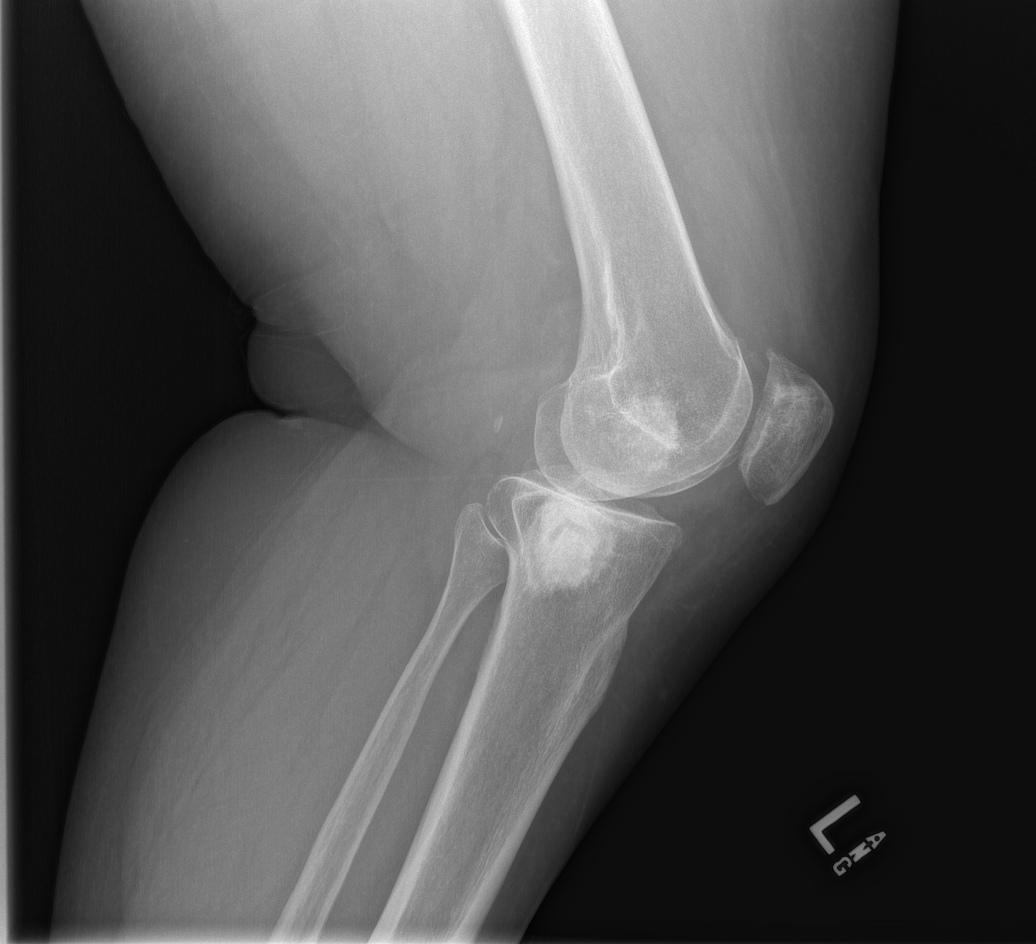

[4 of 4 positions shown; findings below may reference images not displayed]

FINDINGS: No evidence of acute fracture, dislocation, or joint effusion. Mild
osteophyte formation is seen involving medial and lateral joint
spaces. Stable sclerotic densities consistent with prior bone
augmentation of the distal femur and proximal tibia. Soft tissues
are unremarkable.
IMPRESSION: Mild degenerative disc disease is noted medially and laterally.
Postsurgical changes as noted above. No acute abnormality is noted.
